# Patient Record
Sex: Male | Born: 2000 | Race: White | Hispanic: No | Marital: Single | Smoking: Never smoker
Health system: Southern US, Community
[De-identification: ages and names within clinical notes are randomized; demographics above are authoritative.]

## PROBLEM LIST (undated history)

## (undated) DIAGNOSIS — J329 Chronic sinusitis, unspecified: Secondary | ICD-10-CM

## (undated) DIAGNOSIS — T7840XA Allergy, unspecified, initial encounter: Secondary | ICD-10-CM

## (undated) HISTORY — DX: Allergy, unspecified, initial encounter: T78.40XA

## (undated) HISTORY — PX: OTHER SURGICAL HISTORY: SHX169

## (undated) HISTORY — PX: TONSILLECTOMY: SUR1361

## (undated) HISTORY — DX: Chronic sinusitis, unspecified: J32.9

---
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
## 2020-02-13 ENCOUNTER — Other Ambulatory Visit (INDEPENDENT_AMBULATORY_CARE_PROVIDER_SITE_OTHER): Payer: Self-pay | Admitting: Otolaryngology

## 2020-02-14 ENCOUNTER — Other Ambulatory Visit (INDEPENDENT_AMBULATORY_CARE_PROVIDER_SITE_OTHER): Payer: Self-pay | Admitting: Otolaryngology

## 2020-03-07 ENCOUNTER — Ambulatory Visit: Payer: Self-pay | Admitting: *Deleted

## 2020-03-07 NOTE — Telephone Encounter (Signed)
Pt's mother calling, pt present, spoke with pt. States he was at [employer] this afternoon, outside, physical training. States extremely hot. Reports vision went black, got dizzy. Did not faint, no LOC. States Sat in shade, drank water. Later went out to lunch. States feels ok now just tired. Home care advise given. Pt verbalizes understanding. Reason for Disposition . Normal mild dehydration suspected (e.g., dizziness, weakness, nausea) from heat exposure  Answer Assessment - Initial Assessment Questions 1. SYMPTOMS: "What symptoms are you concerned about?"     Mother concerned he is sleepy 2. ONSET:  "When did the symptoms start?"    1 episode 3. FEVER: "Do you have a fever?" If Yes, ask: "What is it, how was it measured, and when did it start?"      no 4. HEAT EXPOSURE: "What caused you to become hot?" (e.g., outside in the sun, inside a hot factory)    Wynelle Link, training 5. PHYSICAL ACTIVITY:  "What type of physical activity were you doing?" (e.g., working, sports)    Doctor, hospital 6. SICK: "Have you been sick recently?" (e.g., cold, flu, recent fever)     no 7. OTHER SYMPTOMS: "Do you have any other symptoms?" (e.g., fainting, flushed skin, weakness, nausea, vomiting, muscle cramps)    Vision black when occured  Protocols used: HEAT EXPOSURE (HEAT EXHAUSTION AND HEAT STROKE)-A-AH

## 2020-03-09 ENCOUNTER — Emergency Department (HOSPITAL_COMMUNITY)
Admission: EM | Admit: 2020-03-09 | Discharge: 2020-03-10 | Disposition: A | Discharge status: Home / Self Care | Payer: Self-pay | Attending: Emergency Medicine | Admitting: Emergency Medicine

## 2020-03-09 ENCOUNTER — Encounter (HOSPITAL_COMMUNITY): Payer: Self-pay

## 2020-03-09 ENCOUNTER — Other Ambulatory Visit: Payer: Self-pay

## 2020-03-09 DIAGNOSIS — R519 Headache, unspecified: Secondary | ICD-10-CM

## 2020-03-09 DIAGNOSIS — T673XXA Heat exhaustion, anhydrotic, initial encounter: Secondary | ICD-10-CM | POA: Insufficient documentation

## 2020-03-09 DIAGNOSIS — Z79899 Other long term (current) drug therapy: Secondary | ICD-10-CM | POA: Insufficient documentation

## 2020-03-09 LAB — CBC
HCT: 41.7 % (ref 39.0–52.0)
Hemoglobin: 13.6 g/dL (ref 13.0–17.0)
MCH: 29.8 pg (ref 26.0–34.0)
MCHC: 32.6 g/dL (ref 30.0–36.0)
MCV: 91.4 fL (ref 80.0–100.0)
Platelets: 240 10*3/uL (ref 150–400)
RBC: 4.56 MIL/uL (ref 4.22–5.81)
RDW: 11.9 % (ref 11.5–15.5)
WBC: 7.3 10*3/uL (ref 4.0–10.5)
nRBC: 0 % (ref 0.0–0.2)

## 2020-03-09 LAB — URINALYSIS, ROUTINE W REFLEX MICROSCOPIC
Bilirubin Urine: NEGATIVE
Glucose, UA: NEGATIVE mg/dL
Hgb urine dipstick: NEGATIVE
Ketones, ur: NEGATIVE mg/dL
Leukocytes,Ua: NEGATIVE
Nitrite: NEGATIVE
Protein, ur: NEGATIVE mg/dL
Specific Gravity, Urine: 1.004 — ABNORMAL LOW (ref 1.005–1.030)
pH: 6 (ref 5.0–8.0)

## 2020-03-09 LAB — BASIC METABOLIC PANEL
Anion gap: 10 (ref 5–15)
BUN: 10 mg/dL (ref 6–20)
CO2: 28 mmol/L (ref 22–32)
Calcium: 9.5 mg/dL (ref 8.9–10.3)
Chloride: 100 mmol/L (ref 98–111)
Creatinine, Ser: 1.14 mg/dL (ref 0.61–1.24)
GFR calc Af Amer: >60 mL/min (ref >60)
GFR calc non Af Amer: >60 mL/min (ref >60)
Glucose, Bld: 92 mg/dL (ref 70–99)
Potassium: 4 mmol/L (ref 3.5–5.1)
Sodium: 138 mmol/L (ref 135–145)

## 2020-03-09 NOTE — ED Triage Notes (Signed)
Pt bib POV with weakness. Stating that he is in the [employer] and on Thursday he was training out in the heat and carrying heavy practice dummies and began to feel really hot, weak and vision became blurry. No LOC but states his head hurts and feels tingly.

## 2020-03-10 NOTE — ED Provider Notes (Signed)
[facility] EMERGENCY DEPARTMENT Provider Note   CSN: [id number] Arrival date & time: 03/09/20  2117     History Chief Complaint  Patient presents with  . Weakness  . Headache    MASSEY RUHLAND is a 19 y.o. male.  Patient is a 19 year old male with no significant medical problems presenting today with symptoms of ongoing headache and generalized fatigue with activity.  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  He reports that he was outside wearing sweatpants and a T-shirt doing strenuous physical activity and 96 degree heat with no water.  He reports he started to develop a headache and his vision was starting to get cloudy and dark and he started to feel generally weak.  He continued to push through but eventually felt like he was going to pass out and was finally given some water.  Since that time he has tried to drink plenty of fluids and stay cool but anytime he is busy doing something he just feels tired and he has continued to have a mild headache on the right side of his head.  When he looks at the light it makes the headache worse but he denies any neck pain, vomiting or vision changes.  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  He denies any specific numbness tingling in 1 side of his body and has no difficulty walking.  He has not used any alcohol in more than 2 weeks and denies any drug use.  He does not take any prescription or over-the-counter medications.  He does work as a Administrator and does report 2 tick bites this summer but most recent was 2 months ago.  He denies any fever, rashes or neck pain.  The history is provided by the patient.  Weakness Associated symptoms: headaches   Headache Associated symptoms: weakness        Past Medical History:  Diagnosis Date  . Allergy     There  are no problems to display for this patient.   Past Surgical History:  Procedure Laterality Date  . TONSILLECTOMY         History reviewed. No pertinent family history.  Social History   Tobacco Use  . Smoking status: Never Smoker  Substance Use Topics  . Alcohol use: Not on file  . Drug use: Not on file    Home Medications Prior to Admission medications   Medication Sig Start Date End Date Taking? Authorizing Provider  azithromycin (ZITHROMAX) 200 MG/5ML suspension Take 12.5 mLs (500 mg total) by mouth daily. For one day, then decrease to 32ml daily for four days 03/09/15   Ethelda Chick, MD  azithromycin [facility]) 200 MG/5ML suspension Take 12.5 mLs (500 mg total) by mouth daily. For one day, then decrease to 65ml daily for four days 03/09/15   Ethelda Chick, MD  triamcinolone (NASACORT AQ) 55 MCG/ACT AERO nasal inhaler Place 2 sprays into the nose daily. 10/03/14   Carmelina Dane, MD    Allergies    Amoxicillin  Review of Systems   Review of Systems  Neurological: Positive for weakness and headaches.  All other systems reviewed and are negative.   Physical Exam Updated Vital Signs BP 122/73   Pulse (!) 45   Temp 97.8 F (36.6 C) (Oral)  Resp 17   Ht 5\' 9"  (1.753 m)   Wt 68 kg   SpO2 100%   BMI 22.15 kg/m   Physical Exam Vitals and nursing note reviewed.  Constitutional:      General: He is not in acute distress.    Appearance: He is well-developed and normal weight.  HENT:     Head: Normocephalic and atraumatic.     Right Ear: Tympanic membrane normal.     Left Ear: Tympanic membrane normal.  Eyes:     Extraocular Movements: Extraocular movements intact.     Conjunctiva/sclera: Conjunctivae normal.     Pupils: Pupils are equal, round, and reactive to light.     Funduscopic exam:    Right eye: No hemorrhage, exudate or papilledema.        Left eye: No hemorrhage, exudate or papilledema.  Neck:     Meningeal: Brudzinski's sign and Kernig's  sign absent.     Comments: Full flexion and extension of the neck.  No lymphadenopathy present. Cardiovascular:     Rate and Rhythm: Normal rate and regular rhythm.     Heart sounds: No murmur heard.   Pulmonary:     Effort: Pulmonary effort is normal. No respiratory distress.     Breath sounds: Normal breath sounds. No wheezing or rales.  Abdominal:     General: There is no distension.     Palpations: Abdomen is soft.     Tenderness: There is no abdominal tenderness. There is no guarding or rebound.  Musculoskeletal:        General: No tenderness. Normal range of motion.     Cervical back: Normal range of motion and neck supple. No tenderness.  Skin:    General: Skin is warm and dry.     Findings: No erythema or rash.  Neurological:     General: No focal deficit present.     Mental Status: He is alert and oriented to person, place, and time. Mental status is at baseline.     Cranial Nerves: No cranial nerve deficit.     Motor: No weakness.     Gait: Gait normal.  Psychiatric:        Mood and Affect: Mood normal.        Behavior: Behavior normal.        Thought Content: Thought content normal.      ED Results / Procedures / Treatments   Labs (all labs ordered are listed, but only abnormal results are displayed) Labs Reviewed  URINALYSIS, ROUTINE W REFLEX MICROSCOPIC - Abnormal; Notable for the following components:      Result Value   Color, Urine STRAW (*)    Specific Gravity, Urine 1.004 (*)    All other components within normal limits  BASIC METABOLIC PANEL  CBC    EKG EKG Interpretation  Date/Time:  Saturday March 09 2020 21:53:46 EDT Ventricular Rate:  48 PR Interval:  156 QRS Duration: 84 QT Interval:  412 QTC Calculation: 368 R Axis:   87 Text Interpretation: Sinus bradycardia Early repolarization Otherwise normal ECG No previous tracing Confirmed by [id number] (Gwyneth Sprout) on 03/10/2020 8:26:16 AM   Radiology No results  found.  Procedures Procedures (including critical care time)  Medications Ordered in ED Medications - No data to display  ED Course  I have reviewed the triage vital signs and the nursing notes.  Pertinent labs & imaging results that were available during my care of the patient were reviewed by me and considered in  my medical decision making (see chart for details).    MDM Rules/Calculators/A&P                          Healthy 19 year old male presenting today with ongoing headache and fatigue after what sounds like heat exhaustion on Thursday.  Patient's labs are reassuring with normal sodium and creatinine.  Urine without signs of myoglobin and patient denies diffuse myalgias concerning for elevated CK.  CBC within normal limits.  Patient denies any infectious symptoms and is afebrile here.  EKG shows bradycardia which is most likely physiologic with early repolarization.  Patient has no localized exam findings and neuro exam is within normal limits.  Patient has no palpable edema or signs for increased intercranial pressure.  He denies any injuries and low suspicion for space-occupying lesion or intracranial bleed.  No indication for CT at this time.  Given he has no complaint of infectious etiology and has no signs of encephalitis or meningitis at this time and low suspicion for RMSF.  Encourage patient to remain hydrated and replace electrolytes.  If he decides to do the test on Monday he needs to maintain hydration throughout the test and not become overheated.  Otherwise patient is safe for discharge.  MDM Number of Diagnoses or Management Options   Amount and/or Complexity of Data Reviewed Clinical lab tests: ordered and reviewed Tests in the medicine section of CPT: ordered and reviewed Obtain history from someone other than the patient: no Independent visualization of images, tracings, or specimens: yes  Risk of Complications, Morbidity, and/or Mortality Presenting problems:  moderate Diagnostic procedures: minimal Management options: minimal  Patient Progress Patient progress: stable   Final Clinical Impression(s) / ED Diagnoses Final diagnoses:  Anhidrotic heat exhaustion, initial encounter  Acute nonintractable headache, unspecified headache type    Rx / DC Orders ED Discharge Orders    None       Gwyneth Sprout, MD 03/10/20 [id number]

## 2020-03-13 NOTE — Progress Notes (Signed)
[facility]    Provider:  Dr Lucia Gaskins Requesting Provider: Emergency room Primary Care Provider:  Ermelinda Das, MD  CC:  Headaches  HPI:  Arthur Contreras is a 19 y.o. male here as requested by emergency room for follow-up of headache. PMHx migraines.  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  Since then he just feels tired, continue to have a mild headache in the right side of his head, headaches worse with light, no neck pain vomiting or vision changes.  He was diagnosed with heat exhaustion.  Labs are reassuring.  Imaging of the brain was not completed.  He is here alone today for follow up. Last Thurday he carried a 50 pound hose on shoulder while also a tank on the back up 5 stories 180 pound dummies 100 feet and lots of strenous activity in the heat. Here with his mother who also provides much information. His vision went black, it was 96 degrees, he was tired, slower thinking, he did not pass out, pre-syncope, weakness, super hot and ringing in the ears, they went to the ED due to tingling on the right side of the head and headache and pulls tight on one side of his face, he felt like his head was going to pull to one side, his father had seizures, stress induced for 7 years, he has been super tired, chronic he had episode a few years ago with lack of sleep he had similar symptoms, brain fog, slower, fatigued and memory problems. In the last few days he is having headaches, pain on the right side, temples  have been tight and temples tingly and hot for some reason, sleeping helps. They have not seen their primary care. He is not getting enough sleep. The headaches are 6/10 in pain, daily, now is a 2, it is pulsating and tight, loud music makes it worse, nausea, dad and sister with migraines. Tingling right side of face. Vision a little blurry. Headaches better laying down, worse standing up. No head trauma.  No other focal neurologic deficits, associated symptoms, inciting events or modifiable factors. No alcohol or drug use. No hydrating. No other focal neurologic deficits, associated symptoms, inciting events or modifiable factors.  Reviewed notes, labs and imaging from outside physicians, which showed:  Cbc,bmp nprmal 03/09/2020  Review of Systems: Patient complains of symptoms per HPI as well as the following symptoms:headache,fatigue. Pertinent negatives and positives per HPI. All others negative.   Social History   Socioeconomic History  . Marital status: Single    Spouse name: Not on file  . Number of children: Not on file  . Years of education: Not on file  . Highest education level: Not on file  Occupational History  . Not on file  Tobacco Use  . Smoking status: Never Smoker  . Smokeless tobacco: Never Used  Vaping Use  . Vaping Use: Never used  Substance and Sexual Activity  . Alcohol use: Yes    Comment: occasionally with friends   .  Drug use: Never  . Sexual activity: Not on file  Other Topics Concern  . Not on file  Social History Narrative   Lives with mother   Caffeine: maybe 2 cups at the most in a day (sweet tea)   Social Determinants of Health   Financial Resource Strain:   . Difficulty of Paying Living Expenses: Not on file  Food Insecurity:   . Worried About Programme researcher, broadcasting/film/video in the Last Year: Not on file  . Ran Out of Food in the Last Year: Not on file  Transportation Needs:   . Lack of Transportation (Medical): Not on file  . Lack of Transportation  (Non-Medical): Not on file  Physical Activity:   . Days of Exercise per Week: Not on file  . Minutes of Exercise per Session: Not on file  Stress:   . Feeling of Stress : Not on file  Social Connections:   . Frequency of Communication with Friends and Family: Not on file  . Frequency of Social Gatherings with Friends and Family: Not on file  . Attends Religious Services: Not on file  . Active Member of Clubs or Organizations: Not on file  . Attends Banker Meetings: Not on file  . Marital Status: Not on file  Intimate Partner Violence:   . Fear of Current or Ex-Partner: Not on file  . Emotionally Abused: Not on file  . Physically Abused: Not on file  . Sexually Abused: Not on file    Family History  Problem Relation Age of Onset  . Migraines Father     Past Medical History:  Diagnosis Date  . Allergy     Patient Active Problem List   Diagnosis Date Noted  . Pre-syncope 03/14/2020  . Nonintractable headache 03/14/2020    Past Surgical History:  Procedure Laterality Date  . TONSILLECTOMY    . tubes in ears      Current Outpatient Medications  Medication Sig Dispense Refill  . Ascorbic Acid (VITAMIN C PO) Take by mouth.    . Multiple Vitamin (MULTIVITAMIN PO) Take by mouth. Natures made     No current facility-administered medications for this visit.    Allergies as of 03/14/2020 - Review Complete 03/14/2020  Allergen Reaction Noted  . Amoxicillin Other (See Comments) 10/03/2014  . Other  03/14/2020    Vitals: BP (!) 107/58 (BP Location: Right Arm, Patient Position: Sitting)   Pulse 62   Ht 5\' 9"  (1.753 m)   Wt 142 lb (64.4 kg)   BMI 20.97 kg/m  Last Weight:  Wt Readings from Last 1 Encounters:  03/14/20 142 lb (64.4 kg)   Last Height:   Ht Readings from Last 1 Encounters:  03/14/20 5\' 9"  (1.753 m)     Physical exam: Exam: Gen: NAD, conversant, well nourised, well groomed                     CV: RRR, no MRG. No Carotid Bruits. No  peripheral edema, warm, nontender Eyes: Conjunctivae clear without exudates or hemorrhage  Neuro: Detailed Neurologic Exam  Speech:    Speech is normal; fluent and spontaneous with normal comprehension.  Cognition:    The patient is oriented to person, place, and time;     recent and remote memory intact;     language fluent;     normal attention, concentration,     fund of knowledge Cranial Nerves:    The pupils are equal, round, and reactive to  light. The fundi are normal and spontaneous venous pulsations are present. Visual fields are full to finger confrontation. Extraocular movements are intact. Trigeminal sensation is intact and the muscles of mastication are normal. The face is symmetric. The palate elevates in the midline. Hearing intact. Voice is normal. Shoulder shrug is normal. The tongue has normal motion without fasciculations.   Coordination:    Normal finger to nose and heel to shin. Normal rapid alternating movements.   Gait:    Heel-toe and tandem gait are normal.   Motor Observation:    No asymmetry, no atrophy, and no involuntary movements noted. Tone:    Normal muscle tone.    Posture:    Posture is normal. normal erect    Strength:    Strength is V/V in the upper and lower limbs.      Sensation: intact to LT     Reflex Exam:  DTR's:    Deep tendon reflexes in the upper and lower extremities are normal bilaterally.   Toes:    The toes are downgoing bilaterally.   Clonus:    Clonus is absent.    Assessment/Plan:  19 year old with new onset headaches in the setting of hard physical activity, heat, dehydration, poor sleep. However given his continued headache, focal symptoms, pre-syncope, blurry vision, ringing in the ears and hearing changes, needs MRI of the brain MRI brain to look for space occupying mass, chiari or intracranial hypertension (pseudotumor), strokes, seizure focus.   Discussed with patient and mother and answered all  questions.  Would take alleve twice daily for several days, hydrate well, sleep well, stay out of the heat if possible, discussed lifestyle factors  Would take alleve twice daily for several days, hydrate well, sleep well, stay out of the heat if possible  May possibly have migraines, follow with Dr. Neva Seat in the future. Return to pcp no follow up needed in neurology unless abnormal MRI or new/worsening symptoms occu   Orders Placed This Encounter  Procedures  . MR BRAIN W WO CONTRAST   Discussed: To prevent or relieve headaches, try the following: Cool Compress. Lie down and place a cool compress on your head.  Avoid headache triggers. If certain foods or odors seem to have triggered your migraines in the past, avoid them. A headache diary might help you identify triggers.  Include physical activity in your daily routine. Try a daily walk or other moderate aerobic exercise.  Manage stress. Find healthy ways to cope with the stressors, such as delegating tasks on your to-do list.  Practice relaxation techniques. Try deep breathing, yoga, massage and visualization.  Eat regularly. Eating regularly scheduled meals and maintaining a healthy diet might help prevent headaches. Also, drink plenty of fluids.  Follow a regular sleep schedule. Sleep deprivation might contribute to headaches Consider biofeedback. With this mind-body technique, you learn to control certain bodily functions -- such as muscle tension, heart rate and blood pressure -- to prevent headaches or reduce headache pain.    Proceed to emergency room if you experience new or worsening symptoms or symptoms do not resolve, if you have new neurologic symptoms or if headache is severe, or for any concerning symptom.   Provided education and documentation from American headache Society toolbox including articles on: chronic migraine medication overuse headache, chronic migraines, prevention of migraines, behavioral and other  nonpharmacologic treatments for headache.   Cc:  Ermelinda Das, MD  Naomie Dean, MD  [facility] [address]  [place], [place] [address]  Phone [phone] Fax [phone]

## 2020-03-14 ENCOUNTER — Other Ambulatory Visit: Payer: Self-pay

## 2020-03-14 ENCOUNTER — Encounter: Payer: Self-pay | Admitting: Neurology

## 2020-03-14 ENCOUNTER — Ambulatory Visit (INDEPENDENT_AMBULATORY_CARE_PROVIDER_SITE_OTHER): Payer: Self-pay | Admitting: Neurology

## 2020-03-14 ENCOUNTER — Telehealth: Payer: Self-pay | Admitting: Neurology

## 2020-03-14 VITALS — BP 107/58 | HR 62 | Ht 69.0 in | Wt 142.0 lb

## 2020-03-14 DIAGNOSIS — R55 Syncope and collapse: Secondary | ICD-10-CM | POA: Insufficient documentation

## 2020-03-14 DIAGNOSIS — H539 Unspecified visual disturbance: Secondary | ICD-10-CM

## 2020-03-14 DIAGNOSIS — R519 Headache, unspecified: Secondary | ICD-10-CM

## 2020-03-14 DIAGNOSIS — R51 Headache with orthostatic component, not elsewhere classified: Secondary | ICD-10-CM

## 2020-03-14 DIAGNOSIS — R5383 Other fatigue: Secondary | ICD-10-CM

## 2020-03-14 DIAGNOSIS — T679XXA Effect of heat and light, unspecified, initial encounter: Secondary | ICD-10-CM

## 2020-03-14 NOTE — Patient Instructions (Signed)
Would take alleve twice daily for several days, hydrate well, sleep well, stay out of the heat if possible MRI brain   Migraine Headache A migraine headache is a very strong throbbing pain on one side or both sides of your head. This type of headache can also cause other symptoms. It can last from 4 hours to 3 days. Talk with your doctor about what things may bring on (trigger) this condition. What are the causes? The exact cause of this condition is not known. This condition may be triggered or caused by:  Drinking alcohol.  Smoking.  Taking medicines, such as: ? Medicine used to treat chest pain (nitroglycerin). ? Birth control pills. ? Estrogen. ? Some blood pressure medicines.  Eating or drinking certain products.  Doing physical activity. Other things that may trigger a migraine headache include:  Having a menstrual period.  Pregnancy.  Hunger.  Stress.  Not getting enough sleep or getting too much sleep.  Weather changes.  Tiredness (fatigue). What increases the risk?  Being 34-49 years old.  Being male.  Having a family history of migraine headaches.  Being Caucasian.  Having depression or anxiety.  Being very overweight. What are the signs or symptoms?  A throbbing pain. This pain may: ? Happen in any area of the head, such as on one side or both sides. ? Make it hard to do daily activities. ? Get worse with physical activity. ? Get worse around bright lights or loud noises.  Other symptoms may include: ? Feeling sick to your stomach (nauseous). ? Vomiting. ? Dizziness. ? Being sensitive to bright lights, loud noises, or smells.  Before you get a migraine headache, you may get warning signs (an aura). An aura may include: ? Seeing flashing lights or having blind spots. ? Seeing bright spots, halos, or zigzag lines. ? Having tunnel vision or blurred vision. ? Having numbness or a tingling feeling. ? Having trouble talking. ? Having weak  muscles.  Some people have symptoms after a migraine headache (postdromal phase), such as: ? Tiredness. ? Trouble thinking (concentrating). How is this treated?  Taking medicines that: ? Relieve pain. ? Relieve the feeling of being sick to your stomach. ? Prevent migraine headaches.  Treatment may also include: ? Having acupuncture. ? Avoiding foods that bring on migraine headaches. ? Learning ways to control your body functions (biofeedback). ? Therapy to help you know and deal with negative thoughts (cognitive behavioral therapy). Follow these instructions at home: Medicines  Take over-the-counter and prescription medicines only as told by your doctor.  Ask your doctor if the medicine prescribed to you: ? Requires you to avoid driving or using heavy machinery. ? Can cause trouble pooping (constipation). You may need to take these steps to prevent or treat trouble pooping:  Drink enough fluid to keep your pee (urine) pale yellow.  Take over-the-counter or prescription medicines.  Eat foods that are high in fiber. These include beans, whole grains, and fresh fruits and vegetables.  Limit foods that are high in fat and sugar. These include fried or sweet foods. Lifestyle  Do not drink alcohol.  Do not use any products that contain nicotine or tobacco, such as cigarettes, e-cigarettes, and chewing tobacco. If you need help quitting, ask your doctor.  Get at least 8 hours of sleep every night.  Limit and deal with stress. General instructions      Keep a journal to find out what may bring on your migraine headaches. For example, write down: ?  What you eat and drink. ? How much sleep you get. ? Any change in what you eat or drink. ? Any change in your medicines.  If you have a migraine headache: ? Avoid things that make your symptoms worse, such as bright lights. ? It may help to lie down in a dark, quiet room. ? Do not drive or use heavy machinery. ? Ask your  doctor what activities are safe for you.  Keep all follow-up visits as told by your doctor. This is important. Contact a doctor if:  You get a migraine headache that is different or worse than others you have had.  You have more than 15 headache days in one month. Get help right away if:  Your migraine headache gets very bad.  Your migraine headache lasts longer than 72 hours.  You have a fever.  You have a stiff neck.  You have trouble seeing.  Your muscles feel weak or like you cannot control them.  You start to lose your balance a lot.  You start to have trouble walking.  You pass out (faint).  You have a seizure. Summary  A migraine headache is a very strong throbbing pain on one side or both sides of your head. These headaches can also cause other symptoms.  This condition may be treated with medicines and changes to your lifestyle.  Keep a journal to find out what may bring on your migraine headaches.  Contact a doctor if you get a migraine headache that is different or worse than others you have had.  Contact your doctor if you have more than 15 headache days in a month. This information is not intended to replace advice given to you by your health care provider. Make sure you discuss any questions you have with your health care provider. Document Revised: 11/04/2018 Document Reviewed: 08/25/2018 Elsevier Patient Education  2020 [employer].

## 2020-03-14 NOTE — Telephone Encounter (Signed)
no to the covid questions MR Brain w/wo contrast Dr. Lucia Gaskins Self pay. Patient is scheduled at [facility] for 03/19/20.

## 2020-03-15 ENCOUNTER — Ambulatory Visit: Payer: Self-pay | Admitting: Neurology

## 2020-03-19 ENCOUNTER — Ambulatory Visit: Payer: Self-pay

## 2020-03-19 ENCOUNTER — Other Ambulatory Visit: Payer: Self-pay

## 2020-03-19 DIAGNOSIS — H539 Unspecified visual disturbance: Secondary | ICD-10-CM

## 2020-03-19 DIAGNOSIS — R51 Headache with orthostatic component, not elsewhere classified: Secondary | ICD-10-CM

## 2020-03-19 DIAGNOSIS — R519 Headache, unspecified: Secondary | ICD-10-CM

## 2020-03-19 MED ORDER — GADOBENATE DIMEGLUMINE 529 MG/ML IV SOLN
13.0000 mL | Freq: Once | INTRAVENOUS | Status: AC | PRN
Start: 2020-03-19 — End: 2020-03-19
  Administered 2020-03-19: 13 mL via INTRAVENOUS

## 2020-03-20 ENCOUNTER — Telehealth: Payer: Self-pay | Admitting: Neurology

## 2020-03-20 NOTE — Telephone Encounter (Signed)
I called the pt's mother back and let her know the MRI results are pending and they will show any abnormalities with brain structure. Her questions were answered. She verbalized appreciation and understanding we will call when we have the results.

## 2020-03-20 NOTE — Telephone Encounter (Signed)
Pt's mother, Griffen Frayne called When reviewing MRI can you also highlight for  ADD, HD for phone triggers.

## 2020-03-21 ENCOUNTER — Telehealth: Payer: Self-pay | Admitting: *Deleted

## 2020-03-21 NOTE — Telephone Encounter (Signed)
Spoke with pt's mother and advised MRI brain normal. She verbalized understanding and appreciation. Her questions were answered.

## 2020-03-21 NOTE — Telephone Encounter (Signed)
-----   Message from Anson Fret, MD sent at 03/21/2020  3:46 PM EDT ----- MRI of the brain is normal thanks

## 2020-08-01 ENCOUNTER — Emergency Department (HOSPITAL_COMMUNITY): Payer: Managed Care, Other (non HMO)

## 2020-08-01 ENCOUNTER — Emergency Department (HOSPITAL_COMMUNITY)
Admission: EM | Admit: 2020-08-01 | Discharge: 2020-08-01 | Disposition: A | Discharge status: Home / Self Care | Payer: Managed Care, Other (non HMO) | Attending: Emergency Medicine | Admitting: Emergency Medicine

## 2020-08-01 ENCOUNTER — Encounter (HOSPITAL_COMMUNITY): Payer: Self-pay | Admitting: Emergency Medicine

## 2020-08-01 DIAGNOSIS — R1011 Right upper quadrant pain: Secondary | ICD-10-CM | POA: Diagnosis not present

## 2020-08-01 DIAGNOSIS — M545 Low back pain, unspecified: Secondary | ICD-10-CM | POA: Insufficient documentation

## 2020-08-01 DIAGNOSIS — U071 COVID-19: Secondary | ICD-10-CM | POA: Insufficient documentation

## 2020-08-01 DIAGNOSIS — R5383 Other fatigue: Secondary | ICD-10-CM | POA: Diagnosis present

## 2020-08-01 DIAGNOSIS — M549 Dorsalgia, unspecified: Secondary | ICD-10-CM

## 2020-08-01 LAB — HEPATIC FUNCTION PANEL
ALT: 26 U/L (ref 0–44)
AST: 37 U/L (ref 15–41)
Albumin: 4.6 g/dL (ref 3.5–5.0)
Alkaline Phosphatase: 93 U/L (ref 38–126)
Bilirubin, Direct: 0.2 mg/dL (ref 0.0–0.2)
Indirect Bilirubin: 1.6 mg/dL — ABNORMAL HIGH (ref 0.3–0.9)
Total Bilirubin: 1.8 mg/dL — ABNORMAL HIGH (ref 0.3–1.2)
Total Protein: 7.3 g/dL (ref 6.5–8.1)

## 2020-08-01 LAB — URINALYSIS, ROUTINE W REFLEX MICROSCOPIC
Bilirubin Urine: NEGATIVE
Glucose, UA: NEGATIVE mg/dL
Hgb urine dipstick: NEGATIVE
Ketones, ur: NEGATIVE mg/dL
Leukocytes,Ua: NEGATIVE
Nitrite: NEGATIVE
Protein, ur: NEGATIVE mg/dL
Specific Gravity, Urine: 1.011 (ref 1.005–1.030)
pH: 8 (ref 5.0–8.0)

## 2020-08-01 LAB — CBC
HCT: 40.3 % (ref 39.0–52.0)
Hemoglobin: 13.7 g/dL (ref 13.0–17.0)
MCH: 30.4 pg (ref 26.0–34.0)
MCHC: 34 g/dL (ref 30.0–36.0)
MCV: 89.4 fL (ref 80.0–100.0)
Platelets: 181 10*3/uL (ref 150–400)
RBC: 4.51 MIL/uL (ref 4.22–5.81)
RDW: 11.7 % (ref 11.5–15.5)
WBC: 8 10*3/uL (ref 4.0–10.5)
nRBC: 0 % (ref 0.0–0.2)

## 2020-08-01 LAB — RESP PANEL BY RT-PCR (FLU A&B, COVID) ARPGX2
Influenza A by PCR: NEGATIVE
Influenza B by PCR: NEGATIVE
SARS Coronavirus 2 by RT PCR: POSITIVE — AB

## 2020-08-01 LAB — BASIC METABOLIC PANEL
Anion gap: 13 (ref 5–15)
BUN: 7 mg/dL (ref 6–20)
CO2: 23 mmol/L (ref 22–32)
Calcium: 9.3 mg/dL (ref 8.9–10.3)
Chloride: 99 mmol/L (ref 98–111)
Creatinine, Ser: 1.22 mg/dL (ref 0.61–1.24)
GFR, Estimated: >60 mL/min (ref >60)
Glucose, Bld: 116 mg/dL — ABNORMAL HIGH (ref 70–99)
Potassium: 3.5 mmol/L (ref 3.5–5.1)
Sodium: 135 mmol/L (ref 135–145)

## 2020-08-01 LAB — SEDIMENTATION RATE: Sed Rate: 4 mm/hr (ref 0–16)

## 2020-08-01 MED ORDER — ACETAMINOPHEN 325 MG PO TABS
650.0000 mg | ORAL_TABLET | Freq: Once | ORAL | Status: AC | PRN
  Administered 2020-08-01: 650 mg via ORAL
  Filled 2020-08-01 (×2): qty 2

## 2020-08-01 NOTE — ED Notes (Signed)
Main lab to add on HFP and sed rate

## 2020-08-01 NOTE — ED Notes (Addendum)
The patient continues to come up the sort asking how long and stating that "I can't take any meds from you because of my liver function." The patient is accompanied now by his mother who is asking to see a Liver or Kidney specialist, processes explained that the patient has to be seen by a providers first." I have offered tylenol but patient refuses.

## 2020-08-01 NOTE — Discharge Instructions (Signed)
Thank you for choosing [facility] for your care today.  To do: Please follow up with GI and with your primary care doctor as planned. Drink lots of fluids. You can take Tylenol every 6 hours for pain and fever control. You should remain quarantined for 5 days or until you have been without a fever for 24 hours, whichever is longer. When you do go back out, you need to continue wearing a mask and social distancing for another 5 days. People who are close to you (family, significant other) need to wear masks and social distance from others as much as possible. They should be tested if they develop symptoms. Please follow up with your primary care doctor in the next few days. If you do not have a PCP you are established with, you can call [phone]  or [phone]  to access [facility] Find A Doctor service. You can also visit [email or web address] Please come back to the Emergency Department if you have shortness of breath, chest pain, confusion/mental status changes, if you have so much nausea/vomiting that you cannot keep down fluids, or if you have any other symptoms that worry you.  Take care. Hope you start feeling better soon.  Corliss Blacker, MD Emergency Medicine

## 2020-08-01 NOTE — ED Notes (Signed)
Patient declined tylenol, stating he had been told not to take any medications that could affect his liver.

## 2020-08-01 NOTE — ED Provider Notes (Signed)
[facility] Provider Note   CSN: [id number] Arrival date & time: 08/01/20  1047     History Chief Complaint  Patient presents with  . Flank Pain    Arthur Contreras is a 20 y.o. male.  HPI Patient is a 20 year old male with a reported history of prior kidney infections who presents to ED with complaint of abdominal pain radiating to his back.  Patient is accompanied by his mother.  They state that for the last 2 or 3 months he has been having right upper quadrant abdominal pain, described as burning.  About 1 month ago, he also noticed bilateral mid back pain, which had resolved; however, it recurred this morning and he is concerned that he is having a problem with his kidneys.  Patient complains of diffuse achiness, fatigue, "brain fog," confusion, as well as headache, cough.  He is unvaccinated against COVID-19 and notes that while he had a negative COVID test 2 weeks ago through school (patient is a Development worker, community) he has been around family for Christmas.  Mother reports that patient had hyperbilirubinemia on recent lab work within the last several months, so patient has been avoiding medications that may harm the liver, including Tylenol. Patient reportedly has an appointment coming up with gastroenterology to further evaluate his RUQ abdominal pain and elevated bilirubin, but he felt the pain was too severe to wait for evaluation until that appointment.  Patient and mother are very concerned that patient may have an autoimmune disease or problem with his liver or kidneys.  No sick contacts other than recent MRSA skin infection (pt's girlfriend). Pt is currently sexually active with his girlfriend and states he uses protection every time. Denies any penile discharge. States he was tested for STIs in the last few months and testing was negative; has not been with any new partners since those negative tests. He does note a "lump" in his L groin that has been  present for about 1 year, which has occasionally been painful in the past but is not painful now.    Past Medical History:  Diagnosis Date  . Allergy     Patient Active Problem List   Diagnosis Date Noted  . Pre-syncope 03/14/2020  . Nonintractable headache 03/14/2020    Past Surgical History:  Procedure Laterality Date  . TONSILLECTOMY    . tubes in ears         Family History  Problem Relation Age of Onset  . Migraines Father     Social History   Tobacco Use  . Smoking status: Never Smoker  . Smokeless tobacco: Never Used  Vaping Use  . Vaping Use: Never used  Substance Use Topics  . Alcohol use: Yes    Comment: occasionally with friends   . Drug use: Never    Home Medications Prior to Admission medications   Medication Sig Start Date End Date Taking? Authorizing Provider  Ascorbic Acid (VITAMIN C PO) Take by mouth.    [provider]  Multiple Vitamin (MULTIVITAMIN PO) Take by mouth. Natures made    [provider]    Allergies    Amoxicillin and Other  Review of Systems   Review of Systems  Constitutional: Positive for fatigue and fever.  HENT: Positive for rhinorrhea and sore throat.   Respiratory: Positive for cough.   Cardiovascular: Positive for palpitations. Negative for leg swelling.  Gastrointestinal: Positive for abdominal pain. Negative for diarrhea and vomiting.  Genitourinary: Negative for dysuria,  hematuria and penile discharge.  Musculoskeletal: Negative for arthralgias and back pain.  Skin: Negative for color change.  Neurological: Positive for light-headedness and headaches. Negative for syncope.  Psychiatric/Behavioral: Positive for confusion. Negative for suicidal ideas.  All other systems reviewed and are negative.   Physical Exam Updated Vital Signs BP 114/71 (BP Location: Right Arm)   Pulse 92   Temp 99.2 F (37.3 C) (Oral)   Resp 18   SpO2 100%   Physical Exam Vitals and nursing note reviewed.   Constitutional:      General: He is not in acute distress.    Appearance: Normal appearance. He is well-developed and well-nourished. He is ill-appearing. He is not toxic-appearing.  HENT:     Head: Normocephalic and atraumatic.     Nose: Rhinorrhea present.     Mouth/Throat:     Comments: Erythema surrounding lips Eyes:     General: No scleral icterus. Cardiovascular:     Rate and Rhythm: Regular rhythm. Tachycardia present.     Pulses: Normal pulses.     Heart sounds: No murmur heard. No friction rub. No gallop.   Pulmonary:     Effort: Pulmonary effort is normal. No respiratory distress.     Breath sounds: Normal breath sounds. No wheezing, rhonchi or rales.  Abdominal:     Palpations: Abdomen is soft.     Tenderness: There is no abdominal tenderness. There is no guarding or rebound.  Genitourinary:    Comments: Chaperoned by [place], RN. Examined L hemiscrotum where pt locates a subcutaneous mass posterior to the testis - I suspect this is the epididymis. No other discrete palpable mass noted in the L testis Musculoskeletal:        General: No edema.     Cervical back: Neck supple.     Right lower leg: No edema.     Left lower leg: No edema.  Skin:    General: Skin is warm and dry.  Neurological:     Mental Status: He is alert.     Comments: Alert, grossly oriented, moves all extremities spontaneously, normal gait  Psychiatric:        Mood and Affect: Mood and affect normal.        Behavior: Behavior normal.     ED Results / Procedures / Treatments   Labs (all labs ordered are listed, but only abnormal results are displayed) Labs Reviewed  RESP PANEL BY RT-PCR (FLU A&B, COVID) ARPGX2 - Abnormal; Notable for the following components:      Result Value   SARS Coronavirus 2 by RT PCR POSITIVE (*)    All other components within normal limits  BASIC METABOLIC PANEL - Abnormal; Notable for the following components:   Glucose, Bld 116 (*)    All other components  within normal limits  HEPATIC FUNCTION PANEL - Abnormal; Notable for the following components:   Total Bilirubin 1.8 (*)    Indirect Bilirubin 1.6 (*)    All other components within normal limits  URINALYSIS, ROUTINE W REFLEX MICROSCOPIC  CBC  SEDIMENTATION RATE  C-REACTIVE PROTEIN  MONONUCLEOSIS SCREEN   Radiology DG Chest Portable 1 View  Result Date: 08/01/2020 CLINICAL DATA:  Right flank and back pain EXAM: PORTABLE CHEST 1 VIEW COMPARISON:  08/05/2009 FINDINGS: The heart size and mediastinal contours are within normal limits. Both lungs are clear. The visualized skeletal structures are unremarkable. IMPRESSION: No active disease. Electronically Signed   By: Randa Ngo M.D.   On: 08/01/2020 21:31  Procedures Procedures (including critical care time)  Medications Ordered in ED Medications  acetaminophen (TYLENOL) tablet 650 mg (650 mg Oral Given 08/01/20 2148)    ED Course  I have reviewed the triage vital signs and the nursing notes.  Pertinent labs & imaging results that were available during my care of the patient were reviewed by me and considered in my medical decision making (see chart for details).    MDM Rules/Calculators/A&P                         20 year old male with back pain, myalgias, multiple constitutional complaints.  Patient is febrile, tachycardic, and mildly ill-appearing on exam but is hemodynamically stable. His abdomen is soft and non-tender, without evidence of peritonitis. I am most concerned for COVID-19 infection, but differential includes pyelonephritis, nephrolithiasis, muscle strain, Gilbert syndrome, pneumonia. Lower suspicion for disseminated STI given report of recent negative test results.  Labs reviewed and notable for elevated creatinine (1.22, near most recent visible results of 1.14) and positive SARS-CoV-2.  No evidence on U/A to suggest UTI, nephrolithiasis. Bilirubin is mildly elevated at 1.8, predominantly indirect. Alk phos and  aminotransferases are normal, lowering suspicion for hepatic injury or biliary tract disease. Pt is not anemic. Imaging interpreted by radiology and images personally reviewed. CXR shows no acute cardiopulmonary abnormality.  Have discussed pt's results with him and with his mother and counseled continued close follow up with PCP and keeping planned visit with GI. I suspect most of pt's acute symptoms today including his fever are related to his SARS-CoV-2 infection. However, his history of ongoing abdominal pain and diffuse joint pain even prior to COVID are possibly concerning for an additional process. Low suspicion for emergent causes of pt's joint pain including septic arthritis (in this pt without stiffness on exam) or bacteremia (in this pt without known risk factors). Pt's abdominal exam is benign at this time and his labs do not reflect emergent hepatic process, significant AKI, or evidence of systemic bacterial infection. Pt has seen multiple providers from numerous specialties over the last 2 years and appears to have had complaints of mental fogginess, palpitations/chest pain, and fatigue for quite some time - all with workup unrevealing for organic cause of symptoms. Given these longstanding complaints as well as mildly elevated creatinine (which may simply be due to poor intake in context of recent illness), I have discussed with the pt that he will need to follow with PCP and may benefit from referral to rheumatology.  At this time, pt appears safe for discharge with strict return precautions provided and instructions for follow up given. Have encouraged aggressive hydration. Pt and family voiced understanding and are amenable to this plan. Pt discharged in stable condition.  Final Clinical Impression(s) / ED Diagnoses Final diagnoses:  Other acute back pain  [id number]      Vanna Scotland, MD 25/36/64 4034    Lajean Saver, MD 08/02/20 1552

## 2020-08-01 NOTE — ED Triage Notes (Signed)
Pt reports severe R flank pain, states he has been having back pain, joint pain and fatigue for the past few months, supposed to see a specialist soon but pain has gotten worse.

## 2020-08-01 NOTE — ED Notes (Signed)
RN Chelsea aware of low blood pressure. Took pressure 2 1.120/34 2. 89/23 now 99/46

## 2020-08-01 NOTE — ED Notes (Signed)
The mother comes to the writer stating "what is your name? If my someone goes in front of him I am going high UP and I do mean high up. There is no reason why people are going back I in front of him." I explained that we have a protocol to follow and that my decisions are supported by the higher ups to see patients based on acuity. My name was provided to as Merry Proud L. (this is on my badge.).

## 2020-08-05 NOTE — Progress Notes (Deleted)
New Patient Note  RE: Arthur Contreras MRN: [id number] DOB: [birth year] Date of Office Visit: 08/06/2020  Referring provider: Ermelinda Das, MD Primary care provider: Ermelinda Das, MD  Chief Complaint: No chief complaint on file.  History of Present Illness: I had the pleasure of seeing Arthur Contreras for [facility]05/[facility]. He is a 20 y.o. male, who is referred here by Ermelinda Das, MD for the evaluation of ***.  ***  Assessment and Plan: Arthur Contreras is a 20 y.o. male with: No problem-specific Assessment & Plan notes found for this encounter.  No follow-ups on file.  No orders of the defined types were placed in this encounter.  Lab Orders  No laboratory test(s) ordered today    Other allergy screening: Asthma: {Blank single:19197::"yes","no"} Rhino conjunctivitis: {Blank single:19197::"yes","no"} Food allergy: {Blank single:19197::"yes","no"} Medication allergy: {Blank single:19197::"yes","no"} Hymenoptera allergy: {Blank single:19197::"yes","no"} Urticaria: {Blank single:19197::"yes","no"} Eczema:{Blank single:19197::"yes","no"} History of recurrent infections suggestive of immunodeficency: {Blank single:19197::"yes","no"}  Diagnostics: Spirometry:  Tracings reviewed. His effort: {Blank single:19197::"Good reproducible efforts.","It was hard to get consistent efforts and there is a question as to whether this reflects a maximal maneuver.","Poor effort, data can not be interpreted."} FVC: ***L FEV1: ***L, ***% predicted FEV1/FVC ratio: ***% Interpretation: {Blank single:19197::"Spirometry consistent with mild obstructive disease","Spirometry consistent with moderate obstructive disease","Spirometry consistent with severe obstructive disease","Spirometry consistent with possible restrictive disease","Spirometry consistent with mixed obstructive and restrictive disease","Spirometry uninterpretable due to  technique","Spirometry consistent with normal pattern","No overt abnormalities noted given today's efforts"}.  Please see scanned spirometry results for details.  Skin Testing: {Blank single:19197::"Select foods","Environmental allergy panel","Environmental allergy panel and select foods","Food allergy panel","None","Deferred due to recent antihistamines use"}. Positive test to: ***. Negative test to: ***.  Results discussed with patient/family.   Past Medical History: Patient Active Problem List   Diagnosis Date Noted  . Pre-syncope 03/14/2020  . Nonintractable headache 03/14/2020   Past Medical History:  Diagnosis Date  . Allergy    Past Surgical History: Past Surgical History:  Procedure Laterality Date  . TONSILLECTOMY    . tubes in ears     Medication List:  Current Outpatient Medications  Medication Sig Dispense Refill  . Ascorbic Acid (VITAMIN C PO) Take by mouth.    . Multiple Vitamin (MULTIVITAMIN PO) Take by mouth. Natures made     No current facility-administered medications for this visit.   Allergies: Allergies  Allergen Reactions  . Amoxicillin Other (See Comments)    Heartburn  . Other     Food sensitivity: caffeine, sugar, artificial color and flavoring, and dairy.  Might affect breathing if he eats a lot or has GI problems    Social History: Social History   Socioeconomic History  . Marital status: Single    Spouse name: Not on file  . Number of children: Not on file  . Years of education: Not on file  . Highest education level: Not on file  Occupational History  . Not on file  Tobacco Use  . Smoking status: Never Smoker  . Smokeless tobacco: Never Used  Vaping Use  . Vaping Use: Never used  Substance and Sexual Activity  . Alcohol use: Yes    Comment: occasionally with friends   . Drug use: Never  . Sexual activity: Not on file  Other Topics Concern  . Not on file  Social History Narrative   Lives with mother   Caffeine: maybe 2 cups  at the most in a day (  sweet tea)   Social Determinants of Health   Financial Resource Strain: Not on file  Food Insecurity: Not on file  Transportation Needs: Not on file  Physical Activity: Not on file  Stress: Not on file  Social Connections: Not on file   Lives in a ***. Smoking: *** Occupation: ***  Environmental HistorySurveyor, minerals in the house: Copywriter, advertising in the family room: {Blank single:19197::"yes","no"} Carpet in the bedroom: {Blank single:19197::"yes","no"} Heating: {Blank single:19197::"electric","gas","heat pump"} Cooling: {Blank single:19197::"central","window","heat pump"} Pet: {Blank single:19197::"yes ***","no"}  Family History: Family History  Problem Relation Age of Onset  . Migraines Father    Problem                               Relation Asthma                                   *** Eczema                                *** Food allergy                          *** Allergic rhino conjunctivitis     ***  Review of Systems  Constitutional: Negative for appetite change, chills, fever and unexpected weight change.  HENT: Negative for congestion and rhinorrhea.   Eyes: Negative for itching.  Respiratory: Negative for cough, chest tightness, shortness of breath and wheezing.   Cardiovascular: Negative for chest pain.  Gastrointestinal: Negative for abdominal pain.  Genitourinary: Negative for difficulty urinating.  Skin: Negative for rash.  Neurological: Negative for headaches.   Objective: There were no vitals taken for this visit. There is no height or weight on file to calculate BMI. Physical Exam Vitals and nursing note reviewed.  Constitutional:      Appearance: Normal appearance. He is well-developed.  HENT:     Head: Normocephalic and atraumatic.     Right Ear: External ear normal.     Left Ear: External ear normal.     Nose: Nose normal.     Mouth/Throat:     Mouth: Mucous membranes are moist.      Pharynx: Oropharynx is clear.  Eyes:     Conjunctiva/sclera: Conjunctivae normal.  Cardiovascular:     Rate and Rhythm: Normal rate and regular rhythm.     Heart sounds: Normal heart sounds. No murmur heard. No friction rub. No gallop.   Pulmonary:     Effort: Pulmonary effort is normal.     Breath sounds: Normal breath sounds. No wheezing, rhonchi or rales.  Abdominal:     Palpations: Abdomen is soft.  Musculoskeletal:     Cervical back: Neck supple.  Skin:    General: Skin is warm.     Findings: No rash.  Neurological:     Mental Status: He is alert and oriented to person, place, and time.  Psychiatric:        Behavior: Behavior normal.    The plan was reviewed with the patient/family, and all questions/concerned were addressed.  It was my pleasure to see Arthur Contreras today and participate in his care. Please feel free to contact me with any questions or concerns.  Sincerely,  Wyline Mood, DO Allergy & Immunology  [facility] office: [place] office: [phone]

## 2020-08-06 ENCOUNTER — Ambulatory Visit: Payer: Managed Care, Other (non HMO) | Admitting: Allergy

## 2020-08-13 ENCOUNTER — Ambulatory Visit: Payer: Managed Care, Other (non HMO) | Admitting: Allergy

## 2020-08-13 NOTE — Progress Notes (Deleted)
New Patient Note  RE: Arthur Contreras A Siciliano MRN: [id number] DOB: 04/09/2001 Date of Office Visit: 08/13/2020  Referring provider: Ermelinda DasGreene, Jeffrey H, MD Primary care provider: Ermelinda DasGreene, Jeffrey H, MD  Chief Complaint: No chief complaint on file.  History of Present Illness: I had the pleasure of seeing Arthur Contreras for initial evaluation at the [facility] on 08/13/2020. He is a 20 y.o. male, who is referred here by Ermelinda DasGreene, Jeffrey H, MD for the evaluation of possible food allergies.  He is accompanied today by his mother who provided/contributed to the history.   He reports food allergy to ***. The reaction occurred at the age of ***, after he ate *** amount of ***. Symptoms started within *** and was in the form of *** hives, swelling, wheezing, abdominal pain, diarrhea, vomiting. ***Denies any associated cofactors such as exertion, infection, NSAID use, or alcohol consumption. The symptoms lasted for ***. He was evaluated in ED and received ***. Since this episode, he does *** not report other accidental exposures to ***. He does *** not have access to epinephrine autoinjector and *** needed to use it.   Past work up includes: immunocap which showed *** and skin prick testing which showed ***.  Dietary History: patient has been eating other foods including ***milk, ***eggs, ***peanut, ***treenuts, ***sesame, ***shellfish, ***fish, ***soy, ***wheat, ***meats, ***fruits and ***vegetables.  He reports reading labels and avoiding *** in diet completely. He tolerates ***baked egg and baked milk products.   08/07/2020 GI visit: "History of Present Illness: Arthur Murphyshton Joerger is a 20 year old male with no significant past medical history who presents for evaluation of abnormal liver tests and RUQ pain. He presents for evaluation today accompanied by his mother.   He reports that he recently has been told that his total bilirubin was mildly elevated, most recently at the ER on 08/01/2020 for  complaints of fatigue, fever and back pain, diagnosed with acute COVID-19 infection. Labs on 08/01/2020 with Tbili 1.8 (indirect bilirubin 1.6), AST 37, ALT 26, ALP 93. Review of prior labs noted Tbili 1.3 in 05/26/2019. He reports that he has had episodes of dark cola-colored urine, initially noted on the day before he took the SAT about 2 years ago. He also had some associated slurred speech and lethargy that day, which was attributed to sleep deprivation. He reports no other episodes of obvious jaundice. He denies any alcohol or drug use. He has not had any liver imaging thus far.   He also reports that over the past 2-3 months, he has developed various symptoms without clear etiology. He describes pain in his abdomen, mostly in the RUQ, which is constant. No relation to eating or BMs. He has had decreased appetite and associated 20lbs weight loss. He has been trying to eat healthier foods lately, but does not eat the volume of food that he typically did previously. No reflux or heartburn. No nausea/vomiting. He has been having regular bowel movements, 2-3 times per day in the recent months, with typical pattern of 1 BM per day prior to his symptom onset. No diarrhea or loose stools. No blood in the stools. He reports an episode of heat exhaustion back in 02/2020, which prompted ER evaluation, due to symptoms of vision changes (vision field "going white"), dizziness/pre-syncope, numbness in left leg, back pain, which has resolved. He continues to have issues with headaches, vision changes and light sensitivity over the past few months. He also reports ongoing "brain fog" and memory loss,  which has caused him to take this semester off from college, out of concern for interfering with his ability to study. His dad has a history of having seizures in college, which were reportedly non-epileptic. He was evaluated by Neurology, without worrisome findings from evaluation, which included MRI brain. He has not had any  seizures. He did take a course of Septra about 1 month ago for sinus infection, which he is often prone to. No other new medications.   ASSESSMENT AND PLAN: Arthur Contreras is a 20 y.o. male with no significant past medical history who presents for evaluation of abnormal liver tests and RUQ pain.  Abnormal liver tests: Recent lab results notable for mildly elevated Tbili (1.3-1.8) with indirect bilirubin predominance in 08/01/2020. Remainder of liver enzymes have been normal. 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 However, given recent development of various symptoms, will evaluate for alternative causes for liver disease, including Wilson's disease, alpha 1 antitrypsin deficiency, iron overload, autoimmune hepatitis or viral hepatitis. He has not had any abdominal or liver imaging studies, so in the setting of elevated bilirubin and RUQ pain, will plan for abdominal US for evaluation of any potential biliary etiology.   He has been having a variety of other symptoms, including headaches, vision changes, peripheral neuropathy, dark urine, memory loss, back pain and mood changes, all of which have been new over the past 2-3 months. Unclear etiology for symptoms after evaluation with Neurology. He has no known family history of porphyria, but will obtain urine porphyrins, urine PBG and ALA today for evaluation of possible AIP, although seems unlikely in this patient. I encouraged him and his mother to establish with a PCP for further investigation of these symptoms, in case his GI evaluation does not reveal any explanation for his symptoms.   Plan: -Liver serologies as above -Abd [place] complete -Urine porphyrins, PBG, ALA"  Assessment and Plan: Arthur Contreras is a 20 y.o. male with: No problem-specific Assessment & Plan notes found for this encounter.  No follow-ups on file.  No orders of the defined types were placed in this encounter.  Lab Orders  No laboratory test(s) ordered today    Other  allergy screening: Asthma: {Blank single:19197::"yes","no"} Rhino conjunctivitis: {Blank single:19197::"yes","no"} Food allergy: {Blank single:19197::"yes","no"} Medication allergy: {Blank single:19197::"yes","no"} Hymenoptera allergy: {Blank single:19197::"yes","no"} Urticaria: {Blank single:19197::"yes","no"} Eczema:{Blank single:19197::"yes","no"} History of recurrent infections suggestive of immunodeficency: {Blank single:19197::"yes","no"}  Diagnostics: Spirometry:  Tracings reviewed. His effort: {Blank single:19197::"Good reproducible efforts.","It was hard to get consistent efforts and there is a question as to whether this reflects a maximal maneuver.","Poor effort, data can not be interpreted."} FVC: ***L FEV1: ***L, ***% predicted FEV1/FVC ratio: ***% Interpretation: {Blank single:19197::"Spirometry consistent with mild obstructive disease","Spirometry consistent with moderate obstructive disease","Spirometry consistent with severe obstructive disease","Spirometry consistent with possible restrictive disease","Spirometry consistent with mixed obstructive and restrictive disease","Spirometry uninterpretable due to technique","Spirometry consistent with normal pattern","No overt abnormalities noted given today's efforts"}.  Please see scanned spirometry results for details.  Skin Testing: {Blank single:19197::"Select foods","Environmental allergy panel","Environmental allergy panel and select foods","Food allergy panel","None","Deferred due to recent antihistamines use"}. Positive test to: ***. Negative test to: ***.  Results discussed with patient/family.   Past Medical History: Patient Active Problem List   Diagnosis Date Noted  . Pre-syncope 03/14/2020  . Nonintractable headache 03/14/2020   Past Medical History:  Diagnosis Date  . Allergy    Past Surgical History: Past Surgical History:  Procedure Laterality Date  . TONSILLECTOMY    . tubes in ears  Medication  List:  Current Outpatient Medications  Medication Sig Dispense Refill  . Ascorbic Acid (VITAMIN C PO) Take by mouth.    . Multiple Vitamin (MULTIVITAMIN PO) Take by mouth. Natures made     No current facility-administered medications for this visit.   Allergies: Allergies  Allergen Reactions  . Amoxicillin Other (See Comments)    Heartburn  . Other     Food sensitivity: caffeine, sugar, artificial color and flavoring, and dairy.  Might affect breathing if he eats a lot or has GI problems    Social History: Social History   Socioeconomic History  . Marital status: Single    Spouse name: Not on file  . Number of children: Not on file  . Years of education: Not on file  . Highest education level: Not on file  Occupational History  . Not on file  Tobacco Use  . Smoking status: Never Smoker  . Smokeless tobacco: Never Used  Vaping Use  . Vaping Use: Never used  Substance and Sexual Activity  . Alcohol use: Yes    Comment: occasionally with friends   . Drug use: Never  . Sexual activity: Not on file  Other Topics Concern  . Not on file  Social History Narrative   Lives with mother   Caffeine: maybe 2 cups at the most in a day (sweet tea)   Social Determinants of Health   Financial Resource Strain: Not on file  Food Insecurity: Not on file  Transportation Needs: Not on file  Physical Activity: Not on file  Stress: Not on file  Social Connections: Not on file   Lives in a ***. Smoking: *** Occupation: ***  Environmental HistorySurveyor, minerals in the house: Copywriter, advertising in the family room: {Blank single:19197::"yes","no"} Carpet in the bedroom: {Blank single:19197::"yes","no"} Heating: {Blank single:19197::"electric","gas","heat pump"} Cooling: {Blank single:19197::"central","window","heat pump"} Pet: {Blank single:19197::"yes ***","no"}  Family History: Family History  Problem Relation Age of Onset  . Migraines Father     Problem                               Relation Asthma                                   *** Eczema                                *** Food allergy                          *** Allergic rhino conjunctivitis     ***  Review of Systems  Constitutional: Negative for appetite change, chills, fever and unexpected weight change.  HENT: Negative for congestion and rhinorrhea.   Eyes: Negative for itching.  Respiratory: Negative for cough, chest tightness, shortness of breath and wheezing.   Cardiovascular: Negative for chest pain.  Gastrointestinal: Negative for abdominal pain.  Genitourinary: Negative for difficulty urinating.  Skin: Negative for rash.  Neurological: Negative for headaches.   Objective: There were no vitals taken for this visit. There is no height or weight on file to calculate BMI. Physical Exam Vitals and nursing note reviewed.  Constitutional:      Appearance: Normal appearance. He is well-developed.  HENT:     Head: Normocephalic and  atraumatic.     Right Ear: External ear normal.     Left Ear: External ear normal.     Nose: Nose normal.     Mouth/Throat:     Mouth: Mucous membranes are moist.     Pharynx: Oropharynx is clear.  Eyes:     Conjunctiva/sclera: Conjunctivae normal.  Cardiovascular:     Rate and Rhythm: Normal rate and regular rhythm.     Heart sounds: Normal heart sounds. No murmur heard. No friction rub. No gallop.   Pulmonary:     Effort: Pulmonary effort is normal.     Breath sounds: Normal breath sounds. No wheezing, rhonchi or rales.  Abdominal:     Palpations: Abdomen is soft.  Musculoskeletal:     Cervical back: Neck supple.  Skin:    General: Skin is warm.     Findings: No rash.  Neurological:     Mental Status: He is alert and oriented to person, place, and time.  Psychiatric:        Behavior: Behavior normal.    The plan was reviewed with the patient/family, and all questions/concerned were addressed.  It was my pleasure  to see Hershell today and participate in his care. Please feel free to contact me with any questions or concerns.  Sincerely,  Wyline Mood, DO Allergy & Immunology  [facility] office: [phone] [facility] office: [phone]

## 2020-08-20 ENCOUNTER — Ambulatory Visit: Payer: Managed Care, Other (non HMO) | Admitting: Allergy

## 2020-08-22 ENCOUNTER — Ambulatory Visit (INDEPENDENT_AMBULATORY_CARE_PROVIDER_SITE_OTHER): Payer: Managed Care, Other (non HMO) | Admitting: Otolaryngology

## 2020-12-25 ENCOUNTER — Ambulatory Visit (HOSPITAL_COMMUNITY)
Admission: EM | Admit: 2020-12-25 | Discharge: 2020-12-25 | Disposition: A | Discharge status: Home / Self Care | Payer: BLUE CROSS/BLUE SHIELD

## 2020-12-25 ENCOUNTER — Encounter (HOSPITAL_COMMUNITY): Payer: Self-pay

## 2020-12-25 ENCOUNTER — Other Ambulatory Visit: Payer: Self-pay

## 2020-12-25 DIAGNOSIS — H9202 Otalgia, left ear: Secondary | ICD-10-CM

## 2020-12-25 DIAGNOSIS — J0100 Acute maxillary sinusitis, unspecified: Secondary | ICD-10-CM

## 2020-12-25 HISTORY — DX: Gilbert syndrome: E80.4

## 2020-12-25 MED ORDER — DOXYCYCLINE HYCLATE 100 MG PO CAPS: 100.0000 mg | ORAL_CAPSULE | Freq: Two times a day (BID) | ORAL | 0 refills | Status: DC

## 2020-12-25 NOTE — ED Triage Notes (Addendum)
Report history of frequent sinus infections which turn into ear infections. Reports had tubes placed in ears which have since come out.  Pt states a month ago started having sinus infection which he states started to subside and came back. Has pain in left ear when eating, head pain, pressure in sinuses and behind both ears, and throat dryness/discomfort, fevers. Reports "hallucinations" but elaborates to state cannot think straight, feels like going to fall over, hot.   Nasal spray and antihistamine for one month have not helped.   Pt states ENT has told him he may need tubes placed again. Reports taking medicine for mucus today but did not really help.

## 2020-12-25 NOTE — Discharge Instructions (Signed)
Take the Doxycycline twice a day for the next 10 days.    Make sure you drink plenty of fluids and rest.   Follow up with your primary care and ENT for further evaluation and long term management of sinus and ear problems.    You can take Tylenol and/or Ibuprofen as needed for fever reduction and pain relief.    For cough: honey 1/2 to 1 teaspoon (you can dilute the honey in water or another fluid).  You can also use guaifenesin and dextromethorphan for cough. You can use a humidifier for chest congestion and cough.  If you don't have a humidifier, you can sit in the bathroom with the hot shower running.     For sore throat: try warm salt water gargles, cepacol lozenges, throat spray, warm tea or water with lemon/honey, popsicles or ice, or OTC cold relief medicine for throat discomfort.    For congestion: take a daily anti-histamine like Zyrtec, Claritin, and a oral decongestant, such as pseudoephedrine.  You can also use Flonase 1-2 sprays in each nostril daily.    It is important to stay hydrated: drink plenty of fluids (water, gatorade/powerade/pedialyte, juices, or teas) to keep your throat moisturized and help further relieve irritation/discomfort.   Return or go to the Emergency Department if symptoms worsen or do not improve in the next few days.

## 2020-12-25 NOTE — Progress Notes (Signed)
New Patient Note  RE: Arthur Contreras MRN: [id number] DOB: 04-Jun-2001 Date of Office Visit: 12/26/2020  Consult requested by: Ermelinda Das, MD Primary care provider: Ermelinda Das, MD  Chief Complaint: Recurrent Sinusitis and Allergies (Possible food intolerances )  History of Present Illness: I had the pleasure of seeing Arthur Contreras for initial evaluation at the [facility] on 12/27/2020. He is a 20 y.o. male, who is self-referred here for the evaluation of food allergies and sinusitis.   Food:  For the past 1-2 years noted some issues with certain foods and concerned whether he is developed some allergies to them.   Breads, grains cause increased thirst. Sweet tea cause some sore spots in his mouth for 1 hour or so and the skin tends to peel off around the inner buccal mucosa area. This only happens if he drinks large quantities of sweet tea. He can drink 1 cup with no issues. Caffeine and sugars causes fogginess at times.   Past work up includes: patient had some type of bloodwork which showed some mild intolerance to rice, carrot, corn and oats.  Oats cause some abdominal pains. Dairy causes some abdominal pains and nasal congestion.  He tolerates rice, carrots and corns.   Dietary History: patient has been eating other foods including limited milk (yogurt), eggs, peanut, treenuts, sesame, shellfish, fish, soy, whole wheat, meats, fruits and vegetables.  Not avoiding any type of food groups currently only limiting some of the above foods that are giving him issues.   Rhinitis: He reports symptoms of nasal congestion, sinus infections, sneezing, ear fullness, red eyes. Symptoms have been going on for many years. The symptoms are present all year around with worsening in spring and fall. Anosmia: diminished sense of smell. Headache: yes. He has used Flonase and azelastine with some improvement in symptoms. Zyrtec caused mood changes as a child. Sinus  infections: yes. Previous work up includes: none. Previous ENT evaluation: yes. Previous sinus imaging: no. History of nasal polyps: no. Last eye exam: 2 years ago. History of reflux: yes in the past - tomatoes and oranges are trigger.  Assessment and Plan: Arthur Contreras is a 20 y.o. male with: Other adverse food reactions, not elsewhere classified, subsequent encounter Patient noted some symptoms after consuming certain foods.  Concerned whether he has developed allergies to them. He had some type of bloodwork drawn and was told he had mild intolerance to rice, carrot, corn and oats. Oats and dairy cause abdominal pains, gluten cause increase thirst, sweet cause sores in the mouth, caffeine and sugar cause fogginess.  Discussed with patient at length that skin prick testing and bloodwork (food IgE levels) check for IgE mediated reactions which his clinical presentation does not support.   Patient would still like to pursue all food testing today.  Today's skin prick testing to food panel was all negative.  Keep a food journal with symptoms and foods eaten.  Continue to limit/avoid foods that are bothersome - gluten, sweet tea, caffeine, sugar, oats, dairy. May have some lactose intolerance  Use lactose free milk or take a lactaid pill right before consuming anything with dairy.  Requesting that patient sends prior lab results via mychart.  Other allergic rhinitis Rhinitis symptoms mainly in the spring and fall.  Currently being followed by ENT for eustachian tube dysfunction and is on Flonase and azelastine nasal spray with good benefit.  Today's skin testing showed: Positive to grass, trees, dust mites.   Start  environmental control measures as below.  Use over the counter antihistamines such as Zyrtec (cetirizine), Claritin (loratadine), Allegra (fexofenadine), or Xyzal (levocetirizine) daily as needed. May take twice a day during allergy flares. May switch antihistamines every few  months.  Use Flonase (fluticasone) nasal spray 1 spray per nostril twice a day as needed for nasal congestion.   Use azelastine nasal spray 1-2 sprays per nostril twice a day as needed for runny nose/drainage.  Nasal saline spray (i.e., Simply Saline) or nasal saline lavage (i.e., NeilMed) is recommended as needed and prior to medicated nasal sprays.  Follow up with ENT as scheduled.  Consider allergy injections for long term control if above medications do not help the symptoms - handout given.   Frequent episodes of sinusitis  Keep track of infections and antibiotic use.   Having environmental allergies can predispose someone to more frequent sinus infections. If still persistent then recommend basic immune evaluation bloodwork next.  Return in about 6 months (around 06/27/2021).  No orders of the defined types were placed in this encounter.  Lab Orders  No laboratory test(s) ordered today    Other allergy screening: Asthma: no Medication allergy: no Hymenoptera allergy: no Urticaria: no Eczema:no History of recurrent infections suggestive of immunodeficency: no  Diagnostics: Skin Testing: Environmental allergy panel and food panel. Positive to grass, trees, dust mites.  Negative to food panel. Results discussed with patient/family.  Airborne Adult Perc - 12/26/20 1508    Time Antigen Placed 1508    Allergen Manufacturer Waynette Buttery    Location Back    Number of Test 59    1. Control-Buffer 50% Glycerol Negative    2. Control-Histamine 1 mg/ml 2+    3. Albumin saline Negative    4. Bahia Negative    5. French Southern Territories Negative    6. Johnson Negative    7. Kentucky Blue Negative    8. Meadow Fescue Negative    9. Perennial Rye 2+    10. Sweet Vernal Negative    11. Timothy 2+    12. Cocklebur Negative    13. Burweed Marshelder Negative    14. Ragweed, short Negative    15. Ragweed, Giant Negative    16. Plantain,  English Negative    17. Lamb's Quarters Negative    18.  Sheep Sorrell Negative    19. Rough Pigweed Negative    20. Marsh Elder, Rough Negative    21. Mugwort, Common Negative    22. Ash mix Negative    23. Birch mix Negative    24. Beech American Negative    25. Box, Elder Negative    26. Cedar, red Negative    27. Cottonwood, Guinea-Bissau Negative    28. Elm mix Negative    29. Hickory Negative    30. Maple mix Negative    31. Oak, Guinea-[place]issau mix Negative    32. Pecan Pollen Negative    33. Pine mix Negative    34. Sycamore Eastern Negative    35. Walnut, Black Pollen Negative    36. Alternaria alternata Negative    37. Cladosporium Herbarum Negative    38. Aspergillus mix Negative    39. Penicillium mix Negative    40. Bipolaris sorokiniana (Helminthosporium) Negative    41. Drechslera spicifera (Curvularia) Negative    42. Mucor plumbeus Negative    43. Fusarium moniliforme Negative    44. Aureobasidium pullulans (pullulara) Negative    45. Rhizopus oryzae Negative    46. Botrytis cinera Negative  47. Epicoccum nigrum Negative    48. Phoma betae Negative    49. Candida Albicans Negative    50. Trichophyton mentagrophytes Negative    51. Mite, D Farinae  5,000 AU/ml Negative    52. Mite, D Pteronyssinus  5,000 AU/ml Negative    53. Cat Hair 10,000 BAU/ml Negative    54.  Dog Epithelia Negative    55. Mixed Feathers Negative    56. Horse Epithelia Negative    57. Cockroach, German Negative    58. Mouse Negative    59. Tobacco Leaf Negative          Intradermal - 12/26/20 1553    Time Antigen Placed 1553    Allergen Manufacturer Waynette Buttery    Location Back    Number of Test 14    Control Negative    French Southern Territories Negative    Johnson Negative    Ragweed mix Negative    Weed mix Negative    Tree mix 2+    Mold 1 Negative    Mold 2 Negative    Mold 3 Negative    Mold 4 Negative    Cat Negative    Dog Negative    Cockroach Negative    Mite mix 2+          Food Adult Perc - 12/26/20 1500    Time Antigen Placed 1509     Allergen Manufacturer Greer    Location Back    Number of allergen test 72    1. Peanut Negative    2. Soybean Negative    3. Wheat Negative    4. Sesame Negative    5. Milk, cow Negative    6. Egg White, Chicken Negative    7. Casein Negative    8. Shellfish Mix Negative    9. Fish Mix Negative    10. Cashew Negative    11. Pecan Food Negative    12. Walnut Food Negative    13. Almond Negative    14. Hazelnut Negative    15. [place] nut Negative    16. Coconut Negative    17. Pistachio Negative    18. Catfish Negative    19. Bass Negative    20. Trout Negative    21. Tuna Negative    22. Salmon Negative    23. Flounder Negative    24. Codfish Negative    25. Shrimp Negative    26. Crab Negative    27. Lobster Negative    28. Oyster Negative    29. Scallops Negative    30. Barley Negative    31. Oat  Negative    32. Rye  Negative    33. Hops Negative    34. Rice Negative    35. Cottonseed Negative    36. Saccharomyces Cerevisiae  Negative    37. Pork Negative    38. [place] Meat Negative    39. Chicken Meat Negative    40. Beef Negative    41. Lamb Negative    42. Tomato Negative    43. White Potato Negative    44. Sweet Potato Negative    45. Pea, Green/English Negative    46. Navy Bean Negative    47. Mushrooms Negative    48. Avocado Negative    49. Onion Negative    50. Cabbage Negative    51. Carrots Negative    52. Celery Negative    53. Corn Negative    54. Cucumber Negative  55. Grape (White seedless) Negative    56. Orange  Negative    57. Banana Negative    58. Apple Negative    59. Peach Negative    60. Strawberry Negative    61. Cantaloupe Negative    62. Watermelon Negative    63. Pineapple Negative    64. Chocolate/Cacao bean Negative    65. Karaya Gum Negative    66. Acacia (Arabic Gum) Negative    67. Cinnamon Negative    68. Nutmeg Negative    69. Ginger Negative    70. Garlic Negative    71. Pepper, black Negative    72.  Mustard Negative           Past Medical History: Patient Active Problem List   Diagnosis Date Noted  . Other adverse food reactions, not elsewhere classified, subsequent encounter 12/27/2020  . Other allergic rhinitis 12/27/2020  . Frequent episodes of sinusitis 12/27/2020  . Pre-syncope 03/14/2020  . Nonintractable headache 03/14/2020   Past Medical History:  Diagnosis Date  . Allergy   . Gilbert's syndrome    per pt report  . Recurrent sinusitis    Past Surgical History: Past Surgical History:  Procedure Laterality Date  . TONSILLECTOMY    . tubes in ears     Medication List:  Current Outpatient Medications  Medication Sig Dispense Refill  . Ascorbic Acid (VITAMIN C PO) Take by mouth.    Marland Kitchen. azelastine (ASTELIN) 0.1 % nasal spray Place 2 sprays into both nostrils 2 (two) times daily.    . Ferrous Sulfate (IRON PO) Take by mouth.    . Multiple Vitamin (MULTIVITAMIN PO) Take by mouth. Natures made    . UNABLE TO FIND daily. Med Name: zinc, calcium D-Glucarate 250mg , iron supplement per pt     No current facility-administered medications for this visit.   Allergies: Allergies  Allergen Reactions  . Other     Food sensitivity: caffeine, sugar, artificial color and flavoring, and dairy.  Might affect breathing if he eats a lot or has GI problems    Social History: Social History   Socioeconomic History  . Marital status: Single    Spouse name: Not on file  . Number of children: Not on file  . Years of education: Not on file  . Highest education level: Not on file  Occupational History  . Not on file  Tobacco Use  . Smoking status: Never Smoker  . Smokeless tobacco: Never Used  Vaping Use  . Vaping Use: Never used  Substance and Sexual Activity  . Alcohol use: Not Currently    Comment: occasionally with friends   . Drug use: Never  . Sexual activity: Not on file  Other Topics Concern  . Not on file  Social History Narrative   Lives with mother    Caffeine: maybe 2 cups at the most in a day (sweet tea)   Social Determinants of Health   Financial Resource Strain: Not on file  Food Insecurity: Not on file  Transportation Needs: Not on file  Physical Activity: Not on file  Stress: Not on file  Social Connections: Not on file   Lives in a 21 year old house. 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 Hyperlipidemia Mother   .  Allergic rhinitis Mother   . Migraines Father   . Seizures Father   . Allergic rhinitis Father   . Diabetes Maternal Grandmother   . Asthma Maternal Grandmother   . Allergic rhinitis Sister   . Allergic rhinitis Brother   . Eczema Neg Hx   . Urticaria Neg Hx   . Angioedema Neg Hx   . Immunodeficiency Neg Hx   . Atopy Neg Hx    Review of Systems  Constitutional: Negative for appetite change, chills, fever and unexpected weight change.  HENT: Positive for congestion, ear pain, rhinorrhea and sneezing.   Eyes: Negative for itching.  Respiratory: Negative for cough, chest tightness, shortness of breath and wheezing.   Cardiovascular: Negative for chest pain.  Gastrointestinal: Negative for abdominal pain.  Genitourinary: Negative for difficulty urinating.  Skin: Negative for rash.  Allergic/Immunologic: Positive for environmental allergies. Negative for food allergies.  Neurological: Positive for headaches.   Objective: BP 110/68   Pulse 83   Temp 98.6 F (37 C) (Tympanic)   Resp 16   Ht  (1.727 m)   Wt 142 lb (64.4 kg)   SpO2 98%   BMI 21.59 kg/m  Body mass index is 21.59 kg/m. Physical Exam Vitals and nursing note reviewed.  Constitutional:      Appearance: Normal appearance. He is well-developed.  HENT:     Head: Normocephalic and  atraumatic.     Right Ear: External ear normal.     Left Ear: External ear normal.     Nose: Nose normal.     Mouth/Throat:     Mouth: Mucous membranes are moist.     Pharynx: Oropharynx is clear.  Eyes:     Conjunctiva/sclera: Conjunctivae normal.  Cardiovascular:     Rate and Rhythm: Normal rate and regular rhythm.     Heart sounds: Normal heart sounds. No murmur heard. No friction rub. No gallop.   Pulmonary:     Effort: Pulmonary effort is normal.     Breath sounds: Normal breath sounds. No wheezing, rhonchi or rales.  Abdominal:     Palpations: Abdomen is soft.  Musculoskeletal:     Cervical back: Neck supple.  Skin:    General: Skin is warm.     Findings: No rash.  Neurological:     Mental Status: He is alert and oriented to person, place, and time.  Psychiatric:        Behavior: Behavior normal.    The plan was reviewed with the patient/family, and all questions/concerned were addressed.  It was my pleasure to see Arthur Contreras today and participate in his care. Please feel free to contact me with any questions or concerns.  Sincerely,  Wyline Mood, DO Allergy & Immunology  [facility] office: [phone] [facility] office: [phone]

## 2020-12-25 NOTE — ED Provider Notes (Signed)
[facility]    CSN: [id number] Arrival date & time: 12/25/20  1940      History   Chief Complaint Chief Complaint  Patient presents with  . Otalgia  . Headache  . sinus/head pressure  . Fever  . Dizziness    HPI Arthur Contreras is a 20 y.o. male.   Report history of frequent sinus infections which turn into ear infections. Reports had tubes placed in ears which have since come out.  Patient here for evaluation of bilateral ear pain and sinus pain/pressure that has been worsening over the past several days.  Reports having similar symptoms about a month ago and started taking Fluticasone and an antihistamine for congestion relief.  Reports minimal relief and symptoms have started to get worse again.  Reports having history of ear pain due to eustachian tube dysfunction and reports having frequent ear/sinur infections.  Reports being told by ENT that he may need to have tubes placed in his ears again but has not follow up with ENT since.  Denies any trauma, injury, or other precipitating event.  Denies any specific alleviating or aggravating factors.  Denies any fevers, chest pain, shortness of breath, N/V/D, numbness, tingling, weakness, abdominal pain.     Otalgia Associated symptoms: fever and headaches   Headache Associated symptoms: dizziness, ear pain, fever and sinus pressure   Fever Associated symptoms: ear pain and headaches   Dizziness Associated symptoms: headaches     Past Medical History:  Diagnosis Date  . Allergy   . Gilbert's syndrome    per pt report    Patient Active Problem List   Diagnosis Date Noted  . Pre-syncope 03/14/2020  . Nonintractable headache 03/14/2020    Past Surgical History:  Procedure Laterality Date  . TONSILLECTOMY    . tubes in ears         Home Medications    Prior to Admission medications   Medication Sig Start Date End Date Taking? Authorizing Provider  Ascorbic Acid (VITAMIN C PO) Take by mouth.   Yes  [provider]  doxycycline (VIBRAMYCIN) 100 MG capsule Take 1 capsule (100 mg total) by mouth 2 (two) times daily. 12/25/20  Yes Ivette Loyal, NP  Multiple Vitamin (MULTIVITAMIN PO) Take by mouth. Natures made   Yes [provider]  UNABLE TO FIND daily. Med Name: zinc, calcium D-Glucarate 250mg , iron supplement per pt   Yes [provider]    Family History Family History  Problem Relation Age of Onset  . Hyperlipidemia Mother   . Migraines Father   . Seizures Father   . Diabetes Maternal Grandmother     Social History Social History   Tobacco Use  . Smoking status: Never Smoker  . Smokeless tobacco: Never Used  Vaping Use  . Vaping Use: Never used  Substance Use Topics  . Alcohol use: Not Currently    Comment: occasionally with friends   . Drug use: Never     Allergies   Amoxicillin and Other   Review of Systems Review of Systems  Constitutional: Positive for fever.  HENT: Positive for ear pain, facial swelling, sinus pressure and sinus pain.   Neurological: Positive for dizziness and headaches.  All other systems reviewed and are negative.    Physical Exam Triage Vital Signs ED Triage Vitals  Enc Vitals Group     BP 12/25/20 2006 129/66     Pulse Rate 12/25/20 2006 75     Resp 12/25/20 2006 18  Temp 12/25/20 2006 98.6 F (37 C)     Temp src --      SpO2 12/25/20 2006 100 %     Weight --      Height --      Head Circumference --      Peak Flow --      Pain Score 12/25/20 1959 5     Pain Loc --      Pain Edu? --      Excl. in GC? --    No data found.  Updated Vital Signs BP 129/66   Pulse 75   Temp 98.6 F (37 C)   Resp 18   SpO2 100%   Visual Acuity Right Eye Distance:   Left Eye Distance:   Bilateral Distance:    Right Eye Near:   Left Eye Near:    Bilateral Near:     Physical Exam Vitals and nursing note reviewed.  Constitutional:      General: He is not in acute distress.    Appearance: Normal  appearance. He is not ill-appearing, toxic-appearing or diaphoretic.  HENT:     Head: Normocephalic and atraumatic.     Right Ear: Tympanic membrane, ear canal and external ear normal.     Left Ear: Tympanic membrane, ear canal and external ear normal.     Nose:     Right Turbinates: Enlarged.     Left Turbinates: Enlarged.     Right Sinus: Maxillary sinus tenderness present. No frontal sinus tenderness.     Left Sinus: Maxillary sinus tenderness present. No frontal sinus tenderness.  Eyes:     Conjunctiva/sclera: Conjunctivae normal.  Cardiovascular:     Rate and Rhythm: Normal rate.     Pulses: Normal pulses.  Pulmonary:     Effort: Pulmonary effort is normal.  Abdominal:     General: Abdomen is flat.  Musculoskeletal:        General: Normal range of motion.     Cervical back: Normal range of motion.  Skin:    General: Skin is warm and dry.  Neurological:     General: No focal deficit present.     Mental Status: He is alert and oriented to person, place, and time.     GCS: GCS eye subscore is 4. GCS verbal subscore is 5. GCS motor subscore is 6.  Psychiatric:        Mood and Affect: Mood normal.      UC Treatments / Results  Labs (all labs ordered are listed, but only abnormal results are displayed) Labs Reviewed - No data to display  EKG   Radiology No results found.  Procedures Procedures (including critical care time)  Medications Ordered in UC Medications - No data to display  Initial Impression / Assessment and Plan / UC Course  I have reviewed the triage vital signs and the nursing notes.  Pertinent labs & imaging results that were available during my care of the patient were reviewed by me and considered in my medical decision making (see chart for details).    Assessment negative for red flags or concerns.  Likely sinusitis.  Patient is allergic to amoxicillin so will treat with doxycycline twice a day for the next 10 days.  Encouraged fluids and  rest.  Follow up with primary care and ENT for further evaluation and long term management of ear and sinus problems.  Discussed conservative symptom management as described in discharge instructions.  Final Clinical Impressions(s) / UC Diagnoses  Final diagnoses:  Acute maxillary sinusitis, recurrence not specified  Ear pain, left     Discharge Instructions     Take the Doxycycline twice a day for the next 10 days.    Make sure you drink plenty of fluids and rest.   Follow up with your primary care and ENT for further evaluation and long term management of sinus and ear problems.    You can take Tylenol and/or Ibuprofen as needed for fever reduction and pain relief.    For cough: honey 1/2 to 1 teaspoon (you can dilute the honey in water or another fluid).  You can also use guaifenesin and dextromethorphan for cough. You can use a humidifier for chest congestion and cough.  If you don't have a humidifier, you can sit in the bathroom with the hot shower running.     For sore throat: try warm salt water gargles, cepacol lozenges, throat spray, warm tea or water with lemon/honey, popsicles or ice, or OTC cold relief medicine for throat discomfort.    For congestion: take a daily anti-histamine like Zyrtec, Claritin, and a oral decongestant, such as pseudoephedrine.  You can also use Flonase 1-2 sprays in each nostril daily.    It is important to stay hydrated: drink plenty of fluids (water, gatorade/powerade/pedialyte, juices, or teas) to keep your throat moisturized and help further relieve irritation/discomfort.   Return or go to the Emergency Department if symptoms worsen or do not improve in the next few days.      ED Prescriptions    Medication Sig Dispense Auth. Provider   doxycycline (VIBRAMYCIN) 100 MG capsule Take 1 capsule (100 mg total) by mouth 2 (two) times daily. 20 capsule Ivette Loyal, NP     PDMP not reviewed this encounter.   Ivette Loyal, NP 12/25/20  2031

## 2020-12-26 ENCOUNTER — Encounter: Payer: Self-pay | Admitting: Allergy

## 2020-12-26 ENCOUNTER — Ambulatory Visit: Payer: Self-pay | Admitting: Allergy

## 2020-12-26 VITALS — BP 110/68 | HR 83 | Temp 98.6°F | Resp 16 | Ht 68.0 in | Wt 142.0 lb

## 2020-12-26 DIAGNOSIS — J329 Chronic sinusitis, unspecified: Secondary | ICD-10-CM

## 2020-12-26 DIAGNOSIS — T781XXD Other adverse food reactions, not elsewhere classified, subsequent encounter: Secondary | ICD-10-CM

## 2020-12-26 DIAGNOSIS — J3089 Other allergic rhinitis: Secondary | ICD-10-CM

## 2020-12-26 NOTE — Patient Instructions (Addendum)
Today's skin testing showed: Positive to grass, trees, dust mites.  Negative to food panel. Results given.  Food:   Discussed with patient at length that skin prick testing and bloodwork (food IgE levels) check for IgE mediated reactions which her clinical presentation does not support.   Keep a food journal with symptoms and foods eaten.  Continue to limit/avoid foods that are bothersome - gluten, sweet tea, caffeine, sugar, oats, dairy. May have some lactose intolerance  Use lactose free milk or take a lactaid pill right before consuming anything with dairy.  Send lab results via myhcart.  Environmental allergies  Start environmental control measures as below.  Use over the counter antihistamines such as Zyrtec (cetirizine), Claritin (loratadine), Allegra (fexofenadine), or Xyzal (levocetirizine) daily as needed. May take twice a day during allergy flares. May switch antihistamines every few months.   Use Flonase (fluticasone) nasal spray 1 spray per nostril twice a day as needed for nasal congestion.   Use azelastine nasal spray 1-2 sprays per nostril twice a day as needed for runny nose/drainage.  Nasal saline spray (i.e., Simply Saline) or nasal saline lavage (i.e., NeilMed) is recommended as needed and prior to medicated nasal sprays.  Follow up with ENT as scheduled.   Consider allergy injections for long term control if above medications do not help the symptoms - handout given.   Sinus infections  Keep track of infections and antibiotic use.   Follow up in 6 months or sooner if needed.   Recommend establishing care with an allergist who is covered through your insurance and start allergy injections for long term control.  Reducing Pollen Exposure . Pollen seasons: trees (spring), grass (summer) and ragweed/weeds (fall). Marland Kitchen Keep windows closed in your home and car to lower pollen exposure.  Lilian Kapur air conditioning in the bedroom and throughout the house if  possible.  . Avoid going out in dry windy days - especially early morning. . Pollen counts are highest between 5 - 10 AM and on dry, hot and windy days.  . Save outside activities for late afternoon or after a heavy rain, when pollen levels are lower.  . Avoid mowing of grass if you have grass pollen allergy. Marland Kitchen Be aware that pollen can also be transported indoors on people and pets.  . Dry your clothes in an automatic dryer rather than hanging them outside where they might collect pollen.  . Rinse hair and eyes before bedtime. Control of House Dust Mite Allergen . Dust mite allergens are a common trigger of allergy and asthma symptoms. While they can be found throughout the house, these microscopic creatures thrive in warm, humid environments such as bedding, upholstered furniture and carpeting. . Because so much time is spent in the bedroom, it is essential to reduce mite levels there.  . Encase pillows, mattresses, and box springs in special allergen-proof fabric covers or airtight, zippered plastic covers.  . Bedding should be washed weekly in hot water (130 F) and dried in a hot dryer. Allergen-proof covers are available for comforters and pillows that can't be regularly washed.  Reyes Ivan the allergy-proof covers every few months. Minimize clutter in the bedroom. Keep pets out of the bedroom.  Marland Kitchen Keep humidity less than 50% by using a dehumidifier or air conditioning. You can buy a humidity measuring device called a hygrometer to monitor this.  . If possible, replace carpets with hardwood, linoleum, or washable area rugs. If that's not possible, vacuum frequently with a vacuum that has  a HEPA filter. . Remove all upholstered furniture and non-washable window drapes from the bedroom. . Remove all non-washable stuffed toys from the bedroom.  Wash stuffed toys weekly.

## 2020-12-27 ENCOUNTER — Encounter: Payer: Self-pay | Admitting: Allergy

## 2020-12-27 DIAGNOSIS — J3089 Other allergic rhinitis: Secondary | ICD-10-CM | POA: Insufficient documentation

## 2020-12-27 DIAGNOSIS — T781XXD Other adverse food reactions, not elsewhere classified, subsequent encounter: Secondary | ICD-10-CM | POA: Insufficient documentation

## 2020-12-27 DIAGNOSIS — J329 Chronic sinusitis, unspecified: Secondary | ICD-10-CM | POA: Insufficient documentation

## 2020-12-27 NOTE — Assessment & Plan Note (Signed)
Patient noted some symptoms after consuming certain foods.  Concerned whether he has developed allergies to them. He had some type of bloodwork drawn and was told he had mild intolerance to rice, carrot, corn and oats. Oats and dairy cause abdominal pains, gluten cause increase thirst, sweet cause sores in the mouth, caffeine and sugar cause fogginess.  Discussed with patient at length that skin prick testing and bloodwork (food IgE levels) check for IgE mediated reactions which his clinical presentation does not support.   Patient would still like to pursue all food testing today.  Today's skin prick testing to food panel was all negative.  Keep a food journal with symptoms and foods eaten.  Continue to limit/avoid foods that are bothersome - gluten, sweet tea, caffeine, sugar, oats, dairy. May have some lactose intolerance  Use lactose free milk or take a lactaid pill right before consuming anything with dairy.  Requesting that patient sends prior lab results via mychart.

## 2020-12-27 NOTE — Assessment & Plan Note (Signed)
Rhinitis symptoms mainly in the spring and fall.  Currently being followed by ENT for eustachian tube dysfunction and is on Flonase and azelastine nasal spray with good benefit.  Today's skin testing showed: Positive to grass, trees, dust mites.   Start environmental control measures as below.  Use over the counter antihistamines such as Zyrtec (cetirizine), Claritin (loratadine), Allegra (fexofenadine), or Xyzal (levocetirizine) daily as needed. May take twice a day during allergy flares. May switch antihistamines every few months.  Use Flonase (fluticasone) nasal spray 1 spray per nostril twice a day as needed for nasal congestion.   Use azelastine nasal spray 1-2 sprays per nostril twice a day as needed for runny nose/drainage.  Nasal saline spray (i.e., Simply Saline) or nasal saline lavage (i.e., NeilMed) is recommended as needed and prior to medicated nasal sprays.  Follow up with ENT as scheduled.  Consider allergy injections for long term control if above medications do not help the symptoms - handout given.

## 2020-12-27 NOTE — Assessment & Plan Note (Signed)
   Keep track of infections and antibiotic use.   Having environmental allergies can predispose someone to more frequent sinus infections. If still persistent then recommend basic immune evaluation bloodwork next.

## 2020-12-30 ENCOUNTER — Encounter: Payer: Self-pay | Admitting: Allergy

## 2021-01-01 ENCOUNTER — Other Ambulatory Visit: Payer: Self-pay | Admitting: *Deleted

## 2021-01-01 MED ORDER — EPINEPHRINE 0.3 MG/0.3ML IJ SOAJ: 0.3000 mg | Freq: Once | INTRAMUSCULAR | 1 refills | Status: AC

## 2021-01-10 ENCOUNTER — Telehealth: Payer: BLUE CROSS/BLUE SHIELD | Admitting: Physician Assistant

## 2021-01-10 DIAGNOSIS — L237 Allergic contact dermatitis due to plants, except food: Secondary | ICD-10-CM | POA: Diagnosis not present

## 2021-01-10 MED ORDER — TRIAMCINOLONE ACETONIDE 0.1 % EX CREA
1.0000 | TOPICAL_CREAM | Freq: Two times a day (BID) | CUTANEOUS | 0 refills | Status: DC
Start: 2021-01-10 — End: 2021-01-23

## 2021-01-10 MED ORDER — PREDNISONE 10 MG PO TABS: ORAL_TABLET | ORAL | 0 refills | Status: DC

## 2021-01-10 NOTE — Progress Notes (Signed)
Virtual Visit Consent   Arthur Contreras, you are scheduled for a virtual visit with a [facility] provider today.     Just as with appointments in the office, your consent must be obtained to participate.  Your consent will be active for this visit and any virtual visit you may have with one of our providers in the next 365 days.     If you have a MyChart account, a copy of this consent can be sent to you electronically.  All virtual visits are billed to your insurance company just like a traditional visit in the office.    As this is a virtual visit, video technology does not allow for your provider to perform a traditional examination.  This may limit your provider's ability to fully assess your condition.  If your provider identifies any concerns that need to be evaluated in person or the need to arrange testing (such as labs, EKG, etc.), we will make arrangements to do so.     Although advances in technology are sophisticated, we cannot ensure that it will always work on either your end or our end.  If the connection with a video visit is poor, the visit may have to be switched to a telephone visit.  With either a video or telephone visit, we are not always able to ensure that we have a secure connection.     I need to obtain your verbal consent now.   Are you willing to proceed with your visit today?    [employer] has provided verbal consent on 01/10/2021 for a virtual visit (video or telephone).   Arthur Contreras, [place]   Date: 01/10/2021 2:06 PM   Virtual Visit via Video Note   I, Arthur Contreras, connected with VALE MOUSSEAU ([id number], [birth year]) on 01/10/21 at  1:30 PM EDT by a video-enabled telemedicine application and verified that I am speaking with the correct person using two identifiers.  Location: Patient: Virtual Visit Location Patient: Home Provider: Virtual Visit Location Provider: Home Office   I discussed the limitations of evaluation and management by  telemedicine and the availability of in person appointments. The patient expressed understanding and agreed to proceed.    History of Present Illness: Arthur Contreras is a 20 y.o. who identifies as a male who was assigned male at birth, and is being seen today for possible poison oak/ivy. Patient endorses having to cut bushes at the house this past week. Was finishing up cutting bushes and realized that some of the plants had poison oak throughout. Over the next 24 hours he developed a itchy blistering rash of left arm which is now spread to right arm, right side, left axillary region and hands bilaterally.  Denies any facial involvement.  Denies any chest tightness, tongue swelling or shortness of breath.  Denies any fever, chills, malaise or fatigue.  Has been applying calamine lotion to the area which helps slightly with itch.  HPI: HPI  Problems:  Patient Active Problem List   Diagnosis Date Noted   Other adverse food reactions, not elsewhere classified, subsequent encounter 12/27/2020   Other allergic rhinitis 12/27/2020   Frequent episodes of sinusitis 12/27/2020   Pre-syncope 03/14/2020   Nonintractable headache 03/14/2020    Allergies:  Allergies  Allergen Reactions   Other     Food sensitivity: caffeine, sugar, artificial color and flavoring, and dairy.  Might affect breathing if he eats a lot or has GI problems    Medications:  Current Outpatient Medications:    predniSONE (DELTASONE) 10 MG tablet, Take 4 tablets (40 mg total) by mouth daily with breakfast for 4 days, THEN 3 tablets (30 mg total) daily with breakfast for 4 days, THEN 2 tablets (20 mg total) daily with breakfast for 3 days, THEN 1 tablet (10 mg total) daily with breakfast for 3 days., Disp: 37 tablet, Rfl: 0   triamcinolone cream (KENALOG) 0.1 %, Apply 1 application topically 2 (two) times daily., Disp: 30 g, Rfl: 0   Ascorbic Acid (VITAMIN C PO), Take by mouth., Disp: , Rfl:    azelastine (ASTELIN) 0.1 % nasal  spray, Place 2 sprays into both nostrils 2 (two) times daily., Disp: , Rfl:    Ferrous Sulfate (IRON PO), Take by mouth., Disp: , Rfl:    Multiple Vitamin (MULTIVITAMIN PO), Take by mouth. Natures made, Disp: , Rfl:    UNABLE TO FIND, daily. Med Name: zinc, calcium D-Glucarate 250mg , iron supplement per pt, Disp: , Rfl:   Observations/Objective: Patient is well-developed, well-nourished in no acute distress.  Resting comfortably on couch at home.  Head is normocephalic, atraumatic.  No labored breathing.  Speech is clear and coherent with logical content.  Patient is alert and oriented at baseline.  Erythematous linear papulovesicular rash noted of left arm, right arm, right side and hands bilaterally.  No noted facial involvement.  Patient denies inguinal involvement.  Assessment and Plan: 1. Poison oak dermatitis - predniSONE (DELTASONE) 10 MG tablet; Take 4 tablets (40 mg total) by mouth daily with breakfast for 4 days, THEN 3 tablets (30 mg total) daily with breakfast for 4 days, THEN 2 tablets (20 mg total) daily with breakfast for 3 days, THEN 1 tablet (10 mg total) daily with breakfast for 3 days.  Dispense: 37 tablet; Refill: 0 - triamcinolone cream (KENALOG) 0.1 %; Apply 1 application topically 2 (two) times daily.  Dispense: 30 g; Refill: 0 Thankfully no alarm signs or symptoms.  Rash is spreading quickly.  Will start prednisone taper as directed.  We will do a 14-day taper to prevent rebound dermatitis.  Rx triamcinolone to apply to most troublesome areas, avoiding use on the face, axillary region or groin.  Discussed supportive measures and OTC medications with patient.  Follow-up if symptoms or not resolving, anything worsens or new symptoms develop.  Follow Up Instructions: I discussed the assessment and treatment plan with the patient. The patient was provided an opportunity to ask questions and all were answered. The patient agreed with the plan and demonstrated an understanding  of the instructions.  A copy of instructions were sent to the patient via MyChart.  The patient was advised to call back or seek an in-person evaluation if the symptoms worsen or if the condition fails to improve as anticipated.  Time:  I spent 10 minutes with the patient via telehealth technology discussing the above problems/concerns.    , PA-C

## 2021-01-10 NOTE — Patient Instructions (Signed)
  Arthur Contreras, thank you for joining Piedad Climes, PA-C for today's virtual visit.  While this provider is not your primary care provider (PCP), if your PCP is located in our provider database this encounter information will be shared with them immediately following your visit.  Consent: (Patient) Arthur Contreras provided verbal consent for this virtual visit at the beginning of the encounter.  Current Medications:  Current Outpatient Medications:    Ascorbic Acid (VITAMIN C PO), Take by mouth., Disp: , Rfl:    azelastine (ASTELIN) 0.1 % nasal spray, Place 2 sprays into both nostrils 2 (two) times daily., Disp: , Rfl:    Ferrous Sulfate (IRON PO), Take by mouth., Disp: , Rfl:    Multiple Vitamin (MULTIVITAMIN PO), Take by mouth. Natures made, Disp: , Rfl:    UNABLE TO FIND, daily. Med Name: zinc, calcium D-Glucarate 250mg , iron supplement per pt, Disp: , Rfl:    Medications ordered in this encounter:  No orders of the defined types were placed in this encounter.    *If you need refills on other medications prior to your next appointment, please contact your pharmacy*  Follow-Up: Call back or seek an in-person evaluation if the symptoms worsen or if the condition fails to improve as anticipated.  Other Instructions Please keep well-hydrated. Try to avoid the heat is much as possible. Cold compresses can be beneficial for itch. You can continue the calamine lotion or you can try over-the-counter Sarna lotion to help as well. Take a daily antihistamine. Take the steroid taper as directed, making sure to complete entire course. I have sent in a steroid cream for you to apply to the most troublesome areas. Do not use on your face, armpits or groin region.  Feel better soon!   If you have been instructed to have an in-person evaluation today at a local Urgent Care facility, please use the link below. It will take you to a list of all of our available [facility],  including address, phone number and hours of operation. Please do not delay care.  [facility]  If you or a family member do not have a primary care provider, use the link below to schedule a visit and establish care. When you choose a [facility] primary care physician or advanced practice provider, you gain a long-term partner in health. Find a Primary Care Provider  Learn more about [facility]'s in-office and virtual care options: [facility] - Get Care Now

## 2021-01-15 DIAGNOSIS — J3089 Other allergic rhinitis: Secondary | ICD-10-CM

## 2021-01-16 ENCOUNTER — Other Ambulatory Visit: Payer: Self-pay | Admitting: Allergy

## 2021-01-16 DIAGNOSIS — J3089 Other allergic rhinitis: Secondary | ICD-10-CM

## 2021-01-16 NOTE — Progress Notes (Signed)
Aeroallergen Immunotherapy   Ordering Provider: Dr. Wyline Mood   Patient Details  Name: Arthur Contreras  MRN: [id number]  Date of Birth: 12/16/2000   Order 1 of 1   Vial Label: G-T-Dm   0.3 ml (Volume)  BAU Concentration -- 7 Grass Mix* 100,000 ([address] [place], [place], [place], [place]rennial [place], [place], Sweet [place], [place])  0.5 ml (Volume)  1:20 Concentration -- Eastern 10 Tree Mix (also Sweet Gum)  0.5 ml (Volume)   AU Concentration -- Mite Mix (DF 5,000 & DP 5,000)    1.3  ml Extract Subtotal  3.7  ml Diluent  5.0  ml Maintenance Total   Schedule:  B  Blue Vial (1:100,000): Schedule B (6 doses)  Yellow Vial (1:10,000): Schedule B (6 doses)  Green Vial (1:1,000): Schedule B (6 doses)  Red Vial (1:100): Schedule A (10 doses)   Special Instructions: 1-2 times per week during build up.

## 2021-01-16 NOTE — Progress Notes (Signed)
VIALS EXP 01-16-22

## 2021-01-21 ENCOUNTER — Ambulatory Visit: Payer: Self-pay

## 2021-01-23 ENCOUNTER — Ambulatory Visit (INDEPENDENT_AMBULATORY_CARE_PROVIDER_SITE_OTHER): Payer: BLUE CROSS/BLUE SHIELD

## 2021-01-23 ENCOUNTER — Telehealth: Payer: BLUE CROSS/BLUE SHIELD | Admitting: Physician Assistant

## 2021-01-23 ENCOUNTER — Other Ambulatory Visit: Payer: Self-pay

## 2021-01-23 DIAGNOSIS — J309 Allergic rhinitis, unspecified: Secondary | ICD-10-CM

## 2021-01-23 DIAGNOSIS — L237 Allergic contact dermatitis due to plants, except food: Secondary | ICD-10-CM

## 2021-01-23 MED ORDER — TRIAMCINOLONE ACETONIDE 0.1 % EX CREA
1.0000 | TOPICAL_CREAM | Freq: Two times a day (BID) | CUTANEOUS | 0 refills | Status: DC
Start: 2021-01-23 — End: 2021-02-04

## 2021-01-23 NOTE — Progress Notes (Signed)
Immunotherapy   Patient Details  Name: Arthur Contreras MRN: [id number] Date of Birth: May 31, 2001  01/23/2021  Arthur Contreras started injections for G-T-DM with an expiration of 01/16/2022. Patient received 0.05 of his blue vial. Patient waited in office for 30 minutes with no problems. Following schedule: B  Frequency:1-2 times per week Epi-Pen:Epi-Pen Available  Consent signed and patient instructions given.   Dub Mikes 01/23/2021, 4:03 PM

## 2021-01-23 NOTE — Progress Notes (Signed)
E-Visit for [employer]  We are sorry that you are not feeing well.  Here is how we plan to help!  Based on what you have shared with me it looks like you have had an allergic reaction to the oily resin from a group of plants.  This resin is very sticky, so it easily attaches to your skin, clothing, tools equipment, and pet's fur.    This blistering rash is often called poison ivy rash although it can come from contact with the leaves, stems and roots of poison ivy, poison oak and poison sumac.  The oily resin contains urushiol (u-ROO-she-ol) that produces a skin rash on exposed skin.  The severity of the rash depends on the amount of urushiol that gets on your skin.  A section of skin with more urushiol on it may develop a rash sooner.  The rash usually develops 12-48 hours after exposure and can last two to three weeks.  Your skin must come in direct contact with the plant's oil to be affected.  Blister fluid doesn't spread the rash.  However, if you come into contact with a piece of clothing or pet fur that has urushiol on it, the rash may spread out.  You can also transfer the oil to other parts of your body with your fingers.  Often the rash looks like a straight line because of the way the plant brushes against your skin.  Most poison ivy treatments are usually limited to self-care methods. Small areas of the rash will typically go away on its own in two to three weeks.  Since your rash is limited, I am recommending that you follow these recommendations:  Make sure that the clothes you were wearing and any towels or sheets that may have come in contact with the oil (urushiol) are washed in detergent and hot water.  Apply Benadryl or Caladryl lotion to the rash.  You may apply these as often as needed to control the itching.  Cool baths also often help with itching.  You may also apply an over-the-counter corticosteroid cream for the first few days.  Take oral antihistamines, such as diphenhydramine  (Benadryl, others), which may also help you sleep better.  Soak in a cool-water bath containing an oatmeal-based bath product (Aveeno).  Place Cool, wet compresses on the affected area for 15 to 30 minutes several times a day.  Avoid a hot shower or bath as this may increase your itching.     I have developed the following plan to treat your condition: I have refilled the triamcinolone cream  What can you do to prevent this rash?  Avoid the plants.  Learn how to identify poison ivy, poison oak and poison sumac in all seasons.  When hiking or engaging in other activities that might expose you to these plants, try to stay on cleared pathways.  If camping, make sure you pitch your tent in an area free of these plants.  Keep pets from running through wooded areas so that urushiol doesn't accidentally stick to their fur, which you may touch.  Remove or kill the plants.  In your yard, you can get rid of poison ivy by applying an herbicide or pulling it out of the ground, including the roots, while wearing heavy gloves.  Afterward remove the gloves and thoroughly wash them and your hands.  Don't burn poison ivy or related plants because the urushiol can be carried by smoke.  Wear protective clothing.  If needed, protect your  skin by wearing socks, boots, pants, long sleeves and vinyl gloves.  Wash your skin right away.  Washing off the oil with soap and water within 30 minutes of exposure may reduce your chances of getting a poison ivy rash.  Even washing after an hour or so can help reduce the severity of the rash.  If you walk through some poison ivy and then later touch your shoes, you may get some urushiol on your hands, which may then transfer to your face or body by touching or rubbing.  If the contaminated object isn't cleaned, the urushiol on it can still cause a skin reaction years later.    Be careful not to reuse towels after you have washed your skin.  Also carefully wash clothing in detergent  and hot water to remove all traces of the oil.  Handle contaminated clothing carefully so you don't transfer the urushiol to yourself, furniture, rugs or appliances.  Remember that pets can carry the oil on their fur and paws.  If you think your pet may be contaminated with urushiol, put on some long rubber gloves and give your pet a bath.  Finally, be careful not to burn these plants as the smoke can contain traces of the oil.  Inhaling the smoke may result in difficulty breathing. If that occurred you should see a physician as soon as possible.  See your doctor right away if:  The reaction is severe or widespread You inhaled the smoke from burning poison ivy and are having difficulty breathing Your skin continues to swell The rash affects your eyes, mouth or genitals Blisters are oozing pus You develop a fever greater than 100 F (37.8 C) The rash doesn't get better within a few weeks.  If you scratch the poison ivy rash, bacteria under your fingernails may cause the skin to become infected.  See your doctor if pus starts oozing from the blisters.  Treatment generally includes antibiotics.  Poison ivy treatments are usually limited to self-care methods.  And the rash typically goes away on its own in two to three weeks.     If the rash is widespread or results in a large number of blisters, your doctor may prescribe an oral corticosteroid, such as prednisone.  If a bacterial infection has developed at the rash site, your doctor may give you a prescription for an oral antibiotic.  MAKE SURE YOU  Understand these instructions. Will watch your condition. Will get help right away if you are not doing well or get worse.   Thank you for choosing an e-visit.  Your e-visit answers were reviewed by a board certified advanced clinical practitioner to complete your personal care plan. Depending upon the condition, your plan could have included both over the counter or prescription  medications.  Please review your pharmacy choice. Make sure the pharmacy is open so you can pick up prescription now. If there is a problem, you may contact your provider through [employer] and have the prescription routed to another pharmacy.  Your safety is important to [place]. If you have drug allergies check your prescription carefully.   For the next 24 hours you can use MyChart to ask questions about today's visit, request a non-urgent call back, or ask for a work or school excuse. You will get an email in the next two days asking about your experience. I hope that your e-visit has been valuable and will speed your recovery.   I provided 5 minutes of non face-to-face  time during this encounter for chart review and documentation.

## 2021-02-04 ENCOUNTER — Telehealth: Payer: BLUE CROSS/BLUE SHIELD | Admitting: Physician Assistant

## 2021-02-04 ENCOUNTER — Ambulatory Visit
Admission: EM | Admit: 2021-02-04 | Discharge: 2021-02-04 | Disposition: A | Discharge status: Home / Self Care | Payer: BLUE CROSS/BLUE SHIELD | Attending: Emergency Medicine | Admitting: Emergency Medicine

## 2021-02-04 ENCOUNTER — Other Ambulatory Visit: Payer: Self-pay

## 2021-02-04 DIAGNOSIS — R5382 Chronic fatigue, unspecified: Secondary | ICD-10-CM | POA: Diagnosis not present

## 2021-02-04 DIAGNOSIS — B820 Intestinal helminthiasis, unspecified: Secondary | ICD-10-CM | POA: Diagnosis not present

## 2021-02-04 DIAGNOSIS — R0602 Shortness of breath: Secondary | ICD-10-CM | POA: Diagnosis not present

## 2021-02-04 DIAGNOSIS — R1084 Generalized abdominal pain: Secondary | ICD-10-CM | POA: Insufficient documentation

## 2021-02-04 DIAGNOSIS — R195 Other fecal abnormalities: Secondary | ICD-10-CM | POA: Insufficient documentation

## 2021-02-04 MED ORDER — FAMOTIDINE 20 MG PO TABS: 20.0000 mg | ORAL_TABLET | Freq: Two times a day (BID) | ORAL | 0 refills | Status: AC

## 2021-02-04 NOTE — Discharge Instructions (Addendum)
Please return stool sample Blood work pending Use Pepcid twice daily for underlying indigestion/burning sensation We will call with results and provide further medicine/treatment based off results

## 2021-02-04 NOTE — ED Triage Notes (Signed)
Pt presents with stool changes x 6 mos since having a dog that had multiple parasites.  Stool had white and/or yellow/orange coating.  Describes them as looking like rice.  Reports itching around anus.  States he has seen worms or parts of worms in stool.  Reports being easily fatigued and random fevers lasting approx an hour.  Has lost 10-15 pounds.

## 2021-02-04 NOTE — Progress Notes (Signed)
Virtual Visit Consent   Arthur Contreras, you are scheduled for a virtual visit with a [facility] provider today.     Just as with appointments in the office, your consent must be obtained to participate.  Your consent will be active for this visit and any virtual visit you may have with one of our providers in the next 365 days.     If you have a MyChart account, a copy of this consent can be sent to you electronically.  All virtual visits are billed to your insurance company just like a traditional visit in the office.    As this is a virtual visit, video technology does not allow for your provider to perform a traditional examination.  This may limit your provider's ability to fully assess your condition.  If your provider identifies any concerns that need to be evaluated in person or the need to arrange testing (such as labs, EKG, etc.), we will make arrangements to do so.     Although advances in technology are sophisticated, we cannot ensure that it will always work on either your end or our end.  If the connection with a video visit is poor, the visit may have to be switched to a telephone visit.  With either a video or telephone visit, we are not always able to ensure that we have a secure connection.     I need to obtain your verbal consent now.   Are you willing to proceed with your visit today?    Arthur Contreras has provided verbal consent on 02/04/2021 for a virtual visit (video or telephone).   Piedad Climes, [place]   Date: 02/04/2021 12:14 PM   Virtual Visit via Video Note   I, Piedad Climes, connected with  Arthur Contreras  ([id number], [birth year]) on 02/04/21 at 12:15 PM EDT by a video-enabled telemedicine application and verified that I am speaking with the correct person using two identifiers.  Location: Patient: Virtual Visit Location Patient: Home Provider: Virtual Visit Location Provider: Home Office   I discussed the limitations of evaluation and management by  telemedicine and the availability of in person appointments. The patient expressed understanding and agreed to proceed.    History of Present Illness: Arthur Contreras is a 20 y.o. who identifies as a male who was assigned male at birth, and is being seen today for concerns of intestinal worms.  Patient endorses over the past day noting worms in his stool along with some flecks of bright red blood.  States he showed to his mother and she was concerned of roundworms.  Mother had a similar issue several months ago thought to obtain this from their new dog who had several worm infestations.  Patient has not had any issue with stool changes up until the past couple days.  He has however noted several months of fatigue, now with some windedness with exertion.  Has also noted intermittent spells of abdominal cramping lasting for several hours at a time.  Notes it can be a week between these episodes.  Denies any nausea or vomiting.  Has had some intermittent fever but these have always been this associated with bouts of otitis media.  HPI: HPI  Problems:  Patient Active Problem List   Diagnosis Date Noted   Other adverse food reactions, not elsewhere classified, subsequent encounter 12/27/2020   Other allergic rhinitis 12/27/2020   Frequent episodes of sinusitis 12/27/2020   Pre-syncope 03/14/2020   Nonintractable headache 03/14/2020  Allergies:  Allergies  Allergen Reactions   Other     Food sensitivity: caffeine, sugar, artificial color and flavoring, and dairy.  Might affect breathing if he eats a lot or has GI problems    Prednisone Other (See Comments)    Caused bloody BM   Medications:  Current Outpatient Medications:    Ascorbic Acid (VITAMIN C PO), Take by mouth., Disp: , Rfl:    azelastine (ASTELIN) 0.1 % nasal spray, Place 2 sprays into both nostrils 2 (two) times daily., Disp: , Rfl:    Ferrous Sulfate (IRON PO), Take by mouth., Disp: , Rfl:    Multiple Vitamin (MULTIVITAMIN PO),  Take by mouth. Natures made, Disp: , Rfl:    triamcinolone cream (KENALOG) 0.1 %, Apply 1 application topically 2 (two) times daily., Disp: 80 g, Rfl: 0   UNABLE TO FIND, daily. Med Name: zinc, calcium D-Glucarate 250mg , iron supplement per pt, Disp: , Rfl:   Observations/Objective: Patient is well-developed, well-nourished in no acute distress.  Resting comfortably at home.  Head is normocephalic, atraumatic.  No labored breathing. Speech is clear and coherent with logical content.  Patient is alert and oriented at baseline.   Assessment and Plan: 1. Intestinal worms 2. Chronic fatigue 3. Shortness of breath on exertion Worms noted in stool x 1.5 days with BRBPR. No melena or tenesmus but abdominal pain, fatigue, SOBOE, concerning for more significant anemia 2/2 infection especially giving he is young and very active and likely has been compensating for the anemia over time. Discussed he needs in-office evaluation instead of just anti-parasitic medication through video visit. Needs examination, vital sign testing, labs and stool studies so we can make sure he gets the most appropriate treatment and nothing is overlooked. They are currently between PCP but mom is going to take him directly to [facility] for evaluation.   Follow Up Instructions: I discussed the assessment and treatment plan with the patient. The patient was provided an opportunity to ask questions and all were answered. The patient agreed with the plan and demonstrated an understanding of the instructions.  A copy of instructions were sent to the patient via MyChart.  The patient was advised to call back or seek an in-person evaluation if the symptoms worsen or if the condition fails to improve as anticipated.  Time:  I spent 12 minutes with the patient via telehealth technology discussing the above problems/concerns.    [facility], PA-C

## 2021-02-05 NOTE — ED Provider Notes (Signed)
[facility]    CSN: [id number] Arrival date & time: 02/04/21  1519      History   Chief Complaint Chief Complaint  Patient presents with   Abdominal Pain    HPI Arthur Contreras is a 20 y.o. male presenting today for evaluation of abnormal stools and abdominal pain.  Reports that over the past 6 months he has had irregular [place] stools which have been worsening recently.  He expresses concern of possible wormlike material in his stool.  Reports that he adopted a dog around the onset who had multiple intestinal parasites/worms.  Reports his mom also had wounds related to be treated for this.  He reports approximately 10 pound weight loss.  Has had generalized fatigue.  Stools will be mucousy, yellow and have small string-like material in them.  Recently has developed increased abdominal discomfort also reports a burning sensation in his upper abdomen.  Denies vomiting.  HPI  Past Medical History:  Diagnosis Date   Allergy    Gilbert's syndrome    per pt report   Recurrent sinusitis     Patient Active Problem List   Diagnosis Date Noted   Other adverse food reactions, not elsewhere classified, subsequent encounter 12/27/2020   Other allergic rhinitis 12/27/2020   Frequent episodes of sinusitis 12/27/2020   Pre-syncope 03/14/2020   Nonintractable headache 03/14/2020    Past Surgical History:  Procedure Laterality Date   TONSILLECTOMY     tubes in ears         Home Medications    Prior to Admission medications   Medication Sig Start Date End Date Taking? Authorizing Provider  Ascorbic Acid (VITAMIN C PO) Take by mouth.   Yes [provider]  azelastine (ASTELIN) 0.1 % nasal spray Place 2 sprays into both nostrils 2 (two) times daily. 12/04/20  Yes [provider]  famotidine (PEPCID) 20 MG tablet Take 1 tablet (20 mg total) by mouth 2 (two) times daily. 02/04/21  Yes Savyon Loken C, PA-C  Ferrous Sulfate (IRON PO) Take by mouth.   Yes  [provider]  Multiple Vitamin (MULTIVITAMIN PO) Take by mouth. Natures made   Yes [provider]  UNABLE TO FIND daily. Med Name: zinc, calcium D-Glucarate 250mg , iron supplement per pt   Yes [provider]    Family History Family History  Problem Relation Age of Onset   Hyperlipidemia Mother    Allergic rhinitis Mother    Migraines Father    Seizures Father    Allergic rhinitis Father    Diabetes Maternal Grandmother    Asthma Maternal Grandmother    Allergic rhinitis Sister    Allergic rhinitis Brother    Eczema Neg Hx    Urticaria Neg Hx    Angioedema Neg Hx    Immunodeficiency Neg Hx    Atopy Neg Hx     Social History Social History   Tobacco Use   Smoking status: Never   Smokeless tobacco: Never  Vaping Use   Vaping Use: Never used  Substance Use Topics   Alcohol use: Not Currently    Comment: occasionally with friends    Drug use: Never     Allergies   Other and Prednisone   Review of Systems Review of Systems  Constitutional:  Positive for fatigue. Negative for fever.  HENT:  Negative for congestion, sinus pressure and sore throat.   Eyes:  Negative for photophobia, pain and visual disturbance.  Respiratory:  Negative for cough and  shortness of breath.   Cardiovascular:  Negative for chest pain.  Gastrointestinal:  Positive for abdominal pain. Negative for blood in stool, nausea and vomiting.  Genitourinary:  Negative for decreased urine volume and hematuria.  Musculoskeletal:  Negative for myalgias, neck pain and neck stiffness.  Neurological:  Negative for dizziness, syncope, facial asymmetry, speech difficulty, weakness, light-headedness, numbness and headaches.    Physical Exam Triage Vital Signs ED Triage Vitals  Enc Vitals Group     BP 02/04/21 1540 119/70     Pulse Rate 02/04/21 1540 61     Resp 02/04/21 1540 18     Temp 02/04/21 1540 98.9 F (37.2 C)     Temp Source 02/04/21 1540 Oral     SpO2 02/04/21  1540 98 %     Weight --      Height --      Head Circumference --      Peak Flow --      Pain Score 02/04/21 1543 0     Pain Loc --      Pain Edu? --      Excl. in [place]? --    No data found.  Updated Vital Signs BP 119/70 (BP Location: Left Arm)   Pulse 61   Temp 98.9 F (37.2 C) (Oral)   Resp 18   SpO2 98%   Visual Acuity Right Eye Distance:   Left Eye Distance:   Bilateral Distance:    Right Eye Near:   Left Eye Near:    Bilateral Near:     Physical Exam Vitals and nursing note reviewed.  Constitutional:      Appearance: He is well-developed.     Comments: No acute distress  HENT:     Head: Normocephalic and atraumatic.     Nose: Nose normal.  Eyes:     Conjunctiva/sclera: Conjunctivae normal.  Cardiovascular:     Rate and Rhythm: Normal rate and regular rhythm.  Pulmonary:     Effort: Pulmonary effort is normal. No respiratory distress.     Comments: Breathing comfortably at rest, CTABL, no wheezing, rales or other adventitious sounds auscultated  Abdominal:     General: There is no distension.     Comments: Soft, nondistended, generalized tenderness throughout abdomen, negative rebound, negative Rovsing, negative McBurney's  Musculoskeletal:        General: Normal range of motion.     Cervical back: Neck supple.  Skin:    General: Skin is warm and dry.  Neurological:     Mental Status: He is alert and oriented to person, place, and time.     UC Treatments / Results  Labs (all labs ordered are listed, but only abnormal results are displayed) Labs Reviewed  CBC WITH DIFFERENTIAL/PLATELET  COMPREHENSIVE METABOLIC PANEL  LIPASE    EKG   Radiology No results found.  Procedures Procedures (including critical care time)  Medications Ordered in UC Medications - No data to display  Initial Impression / Assessment and Plan / UC Course  I have reviewed the triage vital signs and the nursing notes.  Pertinent labs & imaging results that were  available during my care of the patient were reviewed by me and considered in my medical decision making (see chart for details).     Abormal stools x6 months-checking GI pathogen panel to evaluate for parasites-we will call with results and provide treatment as needed, patient is to return stool sample. Generalized abdominal pain-checking basic labs given persistent symptoms as well as  associated weight loss and fatigue.  CBC with differential, CMP and lipase. Discussed strict return precautions. Patient verbalized understanding and is agreeable with plan.  Final Clinical Impressions(s) / UC Diagnoses   Final diagnoses:  Generalized abdominal pain  Abnormal stools     Discharge Instructions      Please return stool sample Blood work pending Use Pepcid twice daily for underlying indigestion/burning sensation We will call with results and provide further medicine/treatment based off results    ED Prescriptions     Medication Sig Dispense Auth. Provider   famotidine (PEPCID) 20 MG tablet Take 1 tablet (20 mg total) by mouth 2 (two) times daily. 30 tablet Lorie Cleckley, [place] C, PA-C      PDMP not reviewed this encounter.   Lew Dawes, [place] 02/05/21 [phone]

## 2021-02-06 LAB — COMPREHENSIVE METABOLIC PANEL
ALT: 18 IU/L (ref 0–44)
AST: 22 IU/L (ref 0–40)
Albumin/Globulin Ratio: 2.1 (ref 1.2–2.2)
Albumin: 4.8 g/dL (ref 4.1–5.2)
Alkaline Phosphatase: 98 IU/L (ref 51–125)
BUN/Creatinine Ratio: 9 (ref 9–20)
BUN: 10 mg/dL (ref 6–20)
Bilirubin Total: 1.2 mg/dL (ref 0.0–1.2)
CO2: 23 mmol/L (ref 20–29)
Calcium: 9.5 mg/dL (ref 8.7–10.2)
Chloride: 101 mmol/L (ref 96–106)
Creatinine, Ser: 1.12 mg/dL (ref 0.76–1.27)
Globulin, Total: 2.3 g/dL (ref 1.5–4.5)
Glucose: 85 mg/dL (ref 65–99)
Potassium: 4 mmol/L (ref 3.5–5.2)
Sodium: 140 mmol/L (ref 134–144)
Total Protein: 7.1 g/dL (ref 6.0–8.5)
eGFR: 96 mL/min/{1.73_m2} (ref >59)

## 2021-02-06 LAB — CBC WITH DIFFERENTIAL/PLATELET
Basophils Absolute: 0.1 10*3/uL (ref 0.0–0.2)
Basos: 1 %
EOS (ABSOLUTE): 0.1 10*3/uL (ref 0.0–0.4)
Eos: 2 %
Hematocrit: 42.4 % (ref 37.5–51.0)
Hemoglobin: 14.4 g/dL (ref 13.0–17.7)
Immature Grans (Abs): 0 10*3/uL (ref 0.0–0.1)
Immature Granulocytes: 0 %
Lymphocytes Absolute: 1.9 10*3/uL (ref 0.7–3.1)
Lymphs: 27 %
MCH: 30.4 pg (ref 26.6–33.0)
MCHC: 34 g/dL (ref 31.5–35.7)
MCV: 90 fL (ref 79–97)
Monocytes Absolute: 0.5 10*3/uL (ref 0.1–0.9)
Monocytes: 7 %
Neutrophils Absolute: 4.3 10*3/uL (ref 1.4–7.0)
Neutrophils: 63 %
Platelets: 215 10*3/uL (ref 150–450)
RBC: 4.74 x10E6/uL (ref 4.14–5.80)
RDW: 12.3 % (ref 11.6–15.4)
WBC: 6.8 10*3/uL (ref 3.4–10.8)

## 2021-02-06 LAB — LIPASE: Lipase: 21 U/L (ref 13–78)

## 2021-02-07 LAB — GASTROINTESTINAL PANEL BY PCR, STOOL (REPLACES STOOL CULTURE)

## 2021-02-11 ENCOUNTER — Ambulatory Visit (INDEPENDENT_AMBULATORY_CARE_PROVIDER_SITE_OTHER): Payer: BLUE CROSS/BLUE SHIELD

## 2021-02-11 ENCOUNTER — Other Ambulatory Visit: Payer: Self-pay

## 2021-02-11 DIAGNOSIS — J309 Allergic rhinitis, unspecified: Secondary | ICD-10-CM

## 2021-02-18 ENCOUNTER — Other Ambulatory Visit: Payer: Self-pay

## 2021-02-18 ENCOUNTER — Ambulatory Visit (INDEPENDENT_AMBULATORY_CARE_PROVIDER_SITE_OTHER): Payer: BLUE CROSS/BLUE SHIELD

## 2021-02-18 DIAGNOSIS — J309 Allergic rhinitis, unspecified: Secondary | ICD-10-CM

## 2021-02-27 ENCOUNTER — Ambulatory Visit (INDEPENDENT_AMBULATORY_CARE_PROVIDER_SITE_OTHER): Payer: BLUE CROSS/BLUE SHIELD

## 2021-02-27 ENCOUNTER — Other Ambulatory Visit: Payer: Self-pay

## 2021-02-27 DIAGNOSIS — J309 Allergic rhinitis, unspecified: Secondary | ICD-10-CM | POA: Diagnosis not present

## 2021-03-11 ENCOUNTER — Other Ambulatory Visit: Payer: Self-pay

## 2021-03-11 ENCOUNTER — Ambulatory Visit (INDEPENDENT_AMBULATORY_CARE_PROVIDER_SITE_OTHER): Payer: BLUE CROSS/BLUE SHIELD

## 2021-03-11 DIAGNOSIS — J309 Allergic rhinitis, unspecified: Secondary | ICD-10-CM | POA: Diagnosis not present

## 2021-03-20 ENCOUNTER — Ambulatory Visit (INDEPENDENT_AMBULATORY_CARE_PROVIDER_SITE_OTHER): Payer: BLUE CROSS/BLUE SHIELD

## 2021-03-20 ENCOUNTER — Other Ambulatory Visit: Payer: Self-pay

## 2021-03-20 DIAGNOSIS — J309 Allergic rhinitis, unspecified: Secondary | ICD-10-CM

## 2021-03-25 ENCOUNTER — Other Ambulatory Visit: Payer: Self-pay

## 2021-03-25 ENCOUNTER — Ambulatory Visit (INDEPENDENT_AMBULATORY_CARE_PROVIDER_SITE_OTHER): Payer: BLUE CROSS/BLUE SHIELD

## 2021-03-25 DIAGNOSIS — J309 Allergic rhinitis, unspecified: Secondary | ICD-10-CM | POA: Diagnosis not present

## 2021-04-01 ENCOUNTER — Ambulatory Visit (INDEPENDENT_AMBULATORY_CARE_PROVIDER_SITE_OTHER): Payer: BLUE CROSS/BLUE SHIELD

## 2021-04-01 ENCOUNTER — Other Ambulatory Visit: Payer: Self-pay

## 2021-04-01 DIAGNOSIS — J309 Allergic rhinitis, unspecified: Secondary | ICD-10-CM | POA: Diagnosis not present

## 2021-04-10 ENCOUNTER — Other Ambulatory Visit: Payer: Self-pay

## 2021-04-10 ENCOUNTER — Ambulatory Visit (INDEPENDENT_AMBULATORY_CARE_PROVIDER_SITE_OTHER): Payer: BLUE CROSS/BLUE SHIELD

## 2021-04-10 DIAGNOSIS — J309 Allergic rhinitis, unspecified: Secondary | ICD-10-CM

## 2021-04-17 ENCOUNTER — Ambulatory Visit (INDEPENDENT_AMBULATORY_CARE_PROVIDER_SITE_OTHER): Payer: Self-pay

## 2021-04-17 ENCOUNTER — Other Ambulatory Visit: Payer: Self-pay

## 2021-04-17 DIAGNOSIS — J309 Allergic rhinitis, unspecified: Secondary | ICD-10-CM

## 2021-04-24 ENCOUNTER — Ambulatory Visit (INDEPENDENT_AMBULATORY_CARE_PROVIDER_SITE_OTHER): Payer: Self-pay

## 2021-04-24 ENCOUNTER — Other Ambulatory Visit: Payer: Self-pay

## 2021-04-24 DIAGNOSIS — J309 Allergic rhinitis, unspecified: Secondary | ICD-10-CM

## 2021-05-01 ENCOUNTER — Other Ambulatory Visit: Payer: Self-pay

## 2021-05-01 ENCOUNTER — Ambulatory Visit (INDEPENDENT_AMBULATORY_CARE_PROVIDER_SITE_OTHER): Payer: Self-pay

## 2021-05-01 DIAGNOSIS — J309 Allergic rhinitis, unspecified: Secondary | ICD-10-CM

## 2021-05-08 ENCOUNTER — Other Ambulatory Visit: Payer: Self-pay

## 2021-05-08 ENCOUNTER — Ambulatory Visit (INDEPENDENT_AMBULATORY_CARE_PROVIDER_SITE_OTHER): Payer: Self-pay

## 2021-05-08 DIAGNOSIS — J309 Allergic rhinitis, unspecified: Secondary | ICD-10-CM

## 2021-05-15 ENCOUNTER — Ambulatory Visit (INDEPENDENT_AMBULATORY_CARE_PROVIDER_SITE_OTHER): Payer: Self-pay

## 2021-05-15 ENCOUNTER — Other Ambulatory Visit: Payer: Self-pay

## 2021-05-15 DIAGNOSIS — J309 Allergic rhinitis, unspecified: Secondary | ICD-10-CM

## 2021-05-27 ENCOUNTER — Ambulatory Visit (INDEPENDENT_AMBULATORY_CARE_PROVIDER_SITE_OTHER): Payer: Self-pay

## 2021-05-27 ENCOUNTER — Other Ambulatory Visit: Payer: Self-pay

## 2021-05-27 DIAGNOSIS — J309 Allergic rhinitis, unspecified: Secondary | ICD-10-CM

## 2021-06-05 ENCOUNTER — Ambulatory Visit (INDEPENDENT_AMBULATORY_CARE_PROVIDER_SITE_OTHER): Payer: Self-pay

## 2021-06-05 ENCOUNTER — Other Ambulatory Visit: Payer: Self-pay

## 2021-06-05 DIAGNOSIS — J309 Allergic rhinitis, unspecified: Secondary | ICD-10-CM

## 2021-06-12 ENCOUNTER — Other Ambulatory Visit: Payer: Self-pay

## 2021-06-12 ENCOUNTER — Ambulatory Visit (INDEPENDENT_AMBULATORY_CARE_PROVIDER_SITE_OTHER): Payer: Self-pay

## 2021-06-12 DIAGNOSIS — J309 Allergic rhinitis, unspecified: Secondary | ICD-10-CM

## 2021-06-24 ENCOUNTER — Other Ambulatory Visit: Payer: Self-pay

## 2021-06-24 ENCOUNTER — Ambulatory Visit (INDEPENDENT_AMBULATORY_CARE_PROVIDER_SITE_OTHER): Payer: Self-pay

## 2021-06-24 DIAGNOSIS — J309 Allergic rhinitis, unspecified: Secondary | ICD-10-CM

## 2021-07-01 ENCOUNTER — Other Ambulatory Visit: Payer: Self-pay

## 2021-07-01 ENCOUNTER — Ambulatory Visit (INDEPENDENT_AMBULATORY_CARE_PROVIDER_SITE_OTHER): Payer: Self-pay

## 2021-07-01 DIAGNOSIS — J309 Allergic rhinitis, unspecified: Secondary | ICD-10-CM

## 2021-07-10 ENCOUNTER — Ambulatory Visit (INDEPENDENT_AMBULATORY_CARE_PROVIDER_SITE_OTHER): Payer: Self-pay

## 2021-07-10 ENCOUNTER — Other Ambulatory Visit: Payer: Self-pay

## 2021-07-10 DIAGNOSIS — J309 Allergic rhinitis, unspecified: Secondary | ICD-10-CM

## 2021-07-31 ENCOUNTER — Other Ambulatory Visit: Payer: Self-pay

## 2021-07-31 ENCOUNTER — Ambulatory Visit (INDEPENDENT_AMBULATORY_CARE_PROVIDER_SITE_OTHER): Payer: Self-pay

## 2021-07-31 DIAGNOSIS — J309 Allergic rhinitis, unspecified: Secondary | ICD-10-CM

## 2021-08-05 ENCOUNTER — Ambulatory Visit (INDEPENDENT_AMBULATORY_CARE_PROVIDER_SITE_OTHER): Payer: Self-pay

## 2021-08-05 ENCOUNTER — Other Ambulatory Visit: Payer: Self-pay

## 2021-08-05 DIAGNOSIS — J309 Allergic rhinitis, unspecified: Secondary | ICD-10-CM

## 2021-08-19 ENCOUNTER — Ambulatory Visit (INDEPENDENT_AMBULATORY_CARE_PROVIDER_SITE_OTHER): Payer: Self-pay | Admitting: *Deleted

## 2021-08-19 ENCOUNTER — Other Ambulatory Visit: Payer: Self-pay

## 2021-08-19 DIAGNOSIS — J309 Allergic rhinitis, unspecified: Secondary | ICD-10-CM

## 2021-08-28 ENCOUNTER — Ambulatory Visit (INDEPENDENT_AMBULATORY_CARE_PROVIDER_SITE_OTHER): Payer: Self-pay

## 2021-08-28 ENCOUNTER — Other Ambulatory Visit: Payer: Self-pay

## 2021-08-28 DIAGNOSIS — J309 Allergic rhinitis, unspecified: Secondary | ICD-10-CM

## 2021-09-04 ENCOUNTER — Other Ambulatory Visit: Payer: Self-pay

## 2021-09-04 ENCOUNTER — Ambulatory Visit (INDEPENDENT_AMBULATORY_CARE_PROVIDER_SITE_OTHER): Payer: Self-pay

## 2021-09-04 DIAGNOSIS — J309 Allergic rhinitis, unspecified: Secondary | ICD-10-CM

## 2021-09-11 ENCOUNTER — Ambulatory Visit (INDEPENDENT_AMBULATORY_CARE_PROVIDER_SITE_OTHER): Payer: Self-pay | Admitting: *Deleted

## 2021-09-11 ENCOUNTER — Other Ambulatory Visit: Payer: Self-pay

## 2021-09-11 DIAGNOSIS — J309 Allergic rhinitis, unspecified: Secondary | ICD-10-CM

## 2021-09-23 ENCOUNTER — Other Ambulatory Visit: Payer: Self-pay

## 2021-09-23 ENCOUNTER — Ambulatory Visit (INDEPENDENT_AMBULATORY_CARE_PROVIDER_SITE_OTHER): Payer: Self-pay

## 2021-09-23 DIAGNOSIS — J309 Allergic rhinitis, unspecified: Secondary | ICD-10-CM

## 2021-10-02 ENCOUNTER — Other Ambulatory Visit: Payer: Self-pay

## 2021-10-02 ENCOUNTER — Ambulatory Visit (INDEPENDENT_AMBULATORY_CARE_PROVIDER_SITE_OTHER): Payer: Self-pay

## 2021-10-02 DIAGNOSIS — J309 Allergic rhinitis, unspecified: Secondary | ICD-10-CM

## 2021-10-16 ENCOUNTER — Other Ambulatory Visit: Payer: Self-pay

## 2021-10-16 ENCOUNTER — Ambulatory Visit (INDEPENDENT_AMBULATORY_CARE_PROVIDER_SITE_OTHER): Payer: Self-pay

## 2021-10-16 DIAGNOSIS — J309 Allergic rhinitis, unspecified: Secondary | ICD-10-CM

## 2021-10-20 DIAGNOSIS — J3089 Other allergic rhinitis: Secondary | ICD-10-CM

## 2021-10-20 NOTE — Progress Notes (Signed)
VIALS EXP 10-21-22 ?

## 2021-10-21 ENCOUNTER — Telehealth: Payer: Self-pay

## 2021-10-21 NOTE — Telephone Encounter (Signed)
Patient called stating he's having some sort of allergic reaction and has hives, I informed him it would be better to come after his hives has cleared up for his allergy injection. He stated he will try to come in Thursday instead.  ?

## 2021-11-11 ENCOUNTER — Ambulatory Visit (INDEPENDENT_AMBULATORY_CARE_PROVIDER_SITE_OTHER): Payer: Self-pay

## 2021-11-11 DIAGNOSIS — J309 Allergic rhinitis, unspecified: Secondary | ICD-10-CM

## 2021-11-20 ENCOUNTER — Ambulatory Visit (INDEPENDENT_AMBULATORY_CARE_PROVIDER_SITE_OTHER): Payer: Self-pay

## 2021-11-20 DIAGNOSIS — J309 Allergic rhinitis, unspecified: Secondary | ICD-10-CM

## 2021-12-02 ENCOUNTER — Ambulatory Visit (INDEPENDENT_AMBULATORY_CARE_PROVIDER_SITE_OTHER): Payer: Self-pay | Admitting: *Deleted

## 2021-12-02 DIAGNOSIS — J309 Allergic rhinitis, unspecified: Secondary | ICD-10-CM

## 2021-12-09 ENCOUNTER — Ambulatory Visit (INDEPENDENT_AMBULATORY_CARE_PROVIDER_SITE_OTHER): Payer: Self-pay | Admitting: *Deleted

## 2021-12-09 DIAGNOSIS — J309 Allergic rhinitis, unspecified: Secondary | ICD-10-CM

## 2021-12-16 ENCOUNTER — Ambulatory Visit (INDEPENDENT_AMBULATORY_CARE_PROVIDER_SITE_OTHER): Payer: Self-pay

## 2021-12-16 DIAGNOSIS — J309 Allergic rhinitis, unspecified: Secondary | ICD-10-CM

## 2022-01-06 ENCOUNTER — Ambulatory Visit (INDEPENDENT_AMBULATORY_CARE_PROVIDER_SITE_OTHER): Payer: Self-pay

## 2022-01-06 DIAGNOSIS — J309 Allergic rhinitis, unspecified: Secondary | ICD-10-CM

## 2022-01-15 ENCOUNTER — Ambulatory Visit (INDEPENDENT_AMBULATORY_CARE_PROVIDER_SITE_OTHER): Payer: Self-pay

## 2022-01-15 DIAGNOSIS — J309 Allergic rhinitis, unspecified: Secondary | ICD-10-CM

## 2022-01-29 ENCOUNTER — Ambulatory Visit (INDEPENDENT_AMBULATORY_CARE_PROVIDER_SITE_OTHER): Payer: Self-pay

## 2022-01-29 DIAGNOSIS — J309 Allergic rhinitis, unspecified: Secondary | ICD-10-CM

## 2022-08-24 ENCOUNTER — Emergency Department (HOSPITAL_BASED_OUTPATIENT_CLINIC_OR_DEPARTMENT_OTHER)
Admission: EM | Admit: 2022-08-24 | Discharge: 2022-08-24 | Disposition: A | Discharge status: Home / Self Care | Payer: No Typology Code available for payment source | Attending: Emergency Medicine | Admitting: Emergency Medicine

## 2022-08-24 ENCOUNTER — Encounter (HOSPITAL_BASED_OUTPATIENT_CLINIC_OR_DEPARTMENT_OTHER): Payer: Self-pay | Admitting: Emergency Medicine

## 2022-08-24 ENCOUNTER — Other Ambulatory Visit: Payer: Self-pay

## 2022-08-24 ENCOUNTER — Emergency Department (HOSPITAL_BASED_OUTPATIENT_CLINIC_OR_DEPARTMENT_OTHER): Payer: No Typology Code available for payment source

## 2022-08-24 DIAGNOSIS — R519 Headache, unspecified: Secondary | ICD-10-CM | POA: Diagnosis present

## 2022-08-24 DIAGNOSIS — Y92410 Unspecified street and highway as the place of occurrence of the external cause: Secondary | ICD-10-CM | POA: Diagnosis not present

## 2022-08-24 DIAGNOSIS — S060X0A Concussion without loss of consciousness, initial encounter: Secondary | ICD-10-CM | POA: Diagnosis not present

## 2022-08-24 DIAGNOSIS — M62830 Muscle spasm of back: Secondary | ICD-10-CM | POA: Insufficient documentation

## 2022-08-24 DIAGNOSIS — M62838 Other muscle spasm: Secondary | ICD-10-CM

## 2022-08-24 MED ORDER — CYCLOBENZAPRINE HCL 10 MG PO TABS: 10.0000 mg | ORAL_TABLET | Freq: Two times a day (BID) | ORAL | 0 refills | Status: AC | PRN

## 2022-08-24 NOTE — Discharge Instructions (Addendum)
CT scan of your head is normal as we suspected.  I do think that you might have concussion and muscle spasm.  Follow-up with sports medicine.  Take Flexeril as needed for muscle spasms.  Be careful with its use as it is sedating.  Do not use with alcohol or drugs.
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
## 2022-08-26 IMAGING — DX DG CHEST 1V PORT
1 series · 1 of 1 positions shown · non-contrast
Comparison: 08/05/2009

CLINICAL DATA: Right flank and back pain

EXAM:
PORTABLE CHEST 1 VIEW

[chest ap]
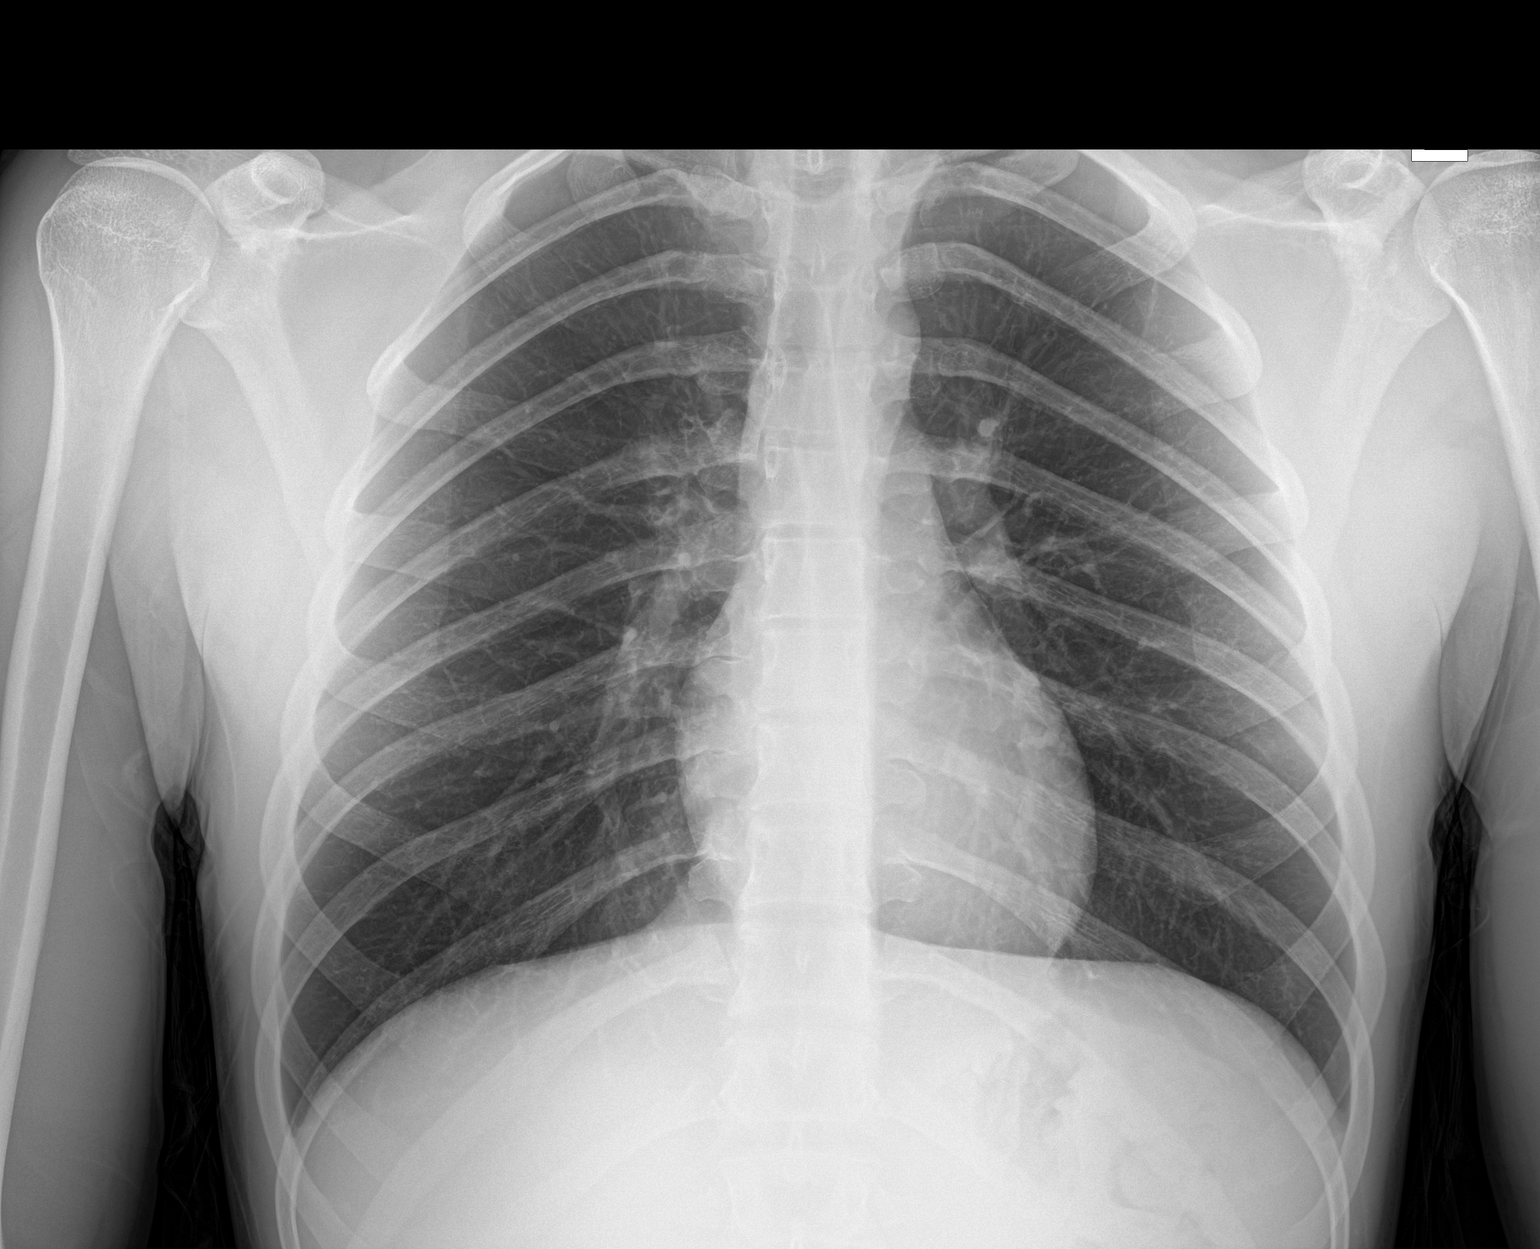

[1 of 1 positions shown; findings below may reference images not displayed]

FINDINGS: The heart size and mediastinal contours are within normal limits.
Both lungs are clear. The visualized skeletal structures are
unremarkable.
IMPRESSION: No active disease.

## 2022-09-08 ENCOUNTER — Emergency Department (HOSPITAL_BASED_OUTPATIENT_CLINIC_OR_DEPARTMENT_OTHER): Payer: No Typology Code available for payment source

## 2022-09-08 ENCOUNTER — Emergency Department (HOSPITAL_BASED_OUTPATIENT_CLINIC_OR_DEPARTMENT_OTHER)
Admission: EM | Admit: 2022-09-08 | Discharge: 2022-09-08 | Disposition: A | Discharge status: Home / Self Care | Payer: No Typology Code available for payment source | Attending: Emergency Medicine | Admitting: Emergency Medicine

## 2022-09-08 ENCOUNTER — Encounter (HOSPITAL_BASED_OUTPATIENT_CLINIC_OR_DEPARTMENT_OTHER): Payer: Self-pay

## 2022-09-08 ENCOUNTER — Other Ambulatory Visit: Payer: Self-pay

## 2022-09-08 DIAGNOSIS — Y92410 Unspecified street and highway as the place of occurrence of the external cause: Secondary | ICD-10-CM | POA: Diagnosis not present

## 2022-09-08 DIAGNOSIS — S161XXA Strain of muscle, fascia and tendon at neck level, initial encounter: Secondary | ICD-10-CM | POA: Insufficient documentation

## 2022-09-08 DIAGNOSIS — M542 Cervicalgia: Secondary | ICD-10-CM

## 2022-09-08 DIAGNOSIS — S199XXA Unspecified injury of neck, initial encounter: Secondary | ICD-10-CM | POA: Diagnosis present
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
## 2022-09-08 NOTE — Discharge Instructions (Signed)
The CT scan did not show any acute abnormalities in your cervical spine.  Discussed with your chiropractor or your primary care doctor about ordering an outpatient MRI
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
